# Patient Record
Sex: Male | Born: 1980 | Race: Black or African American | Hispanic: No | Marital: Single | Smoking: Current every day smoker
Health system: Southern US, Community
[De-identification: ages and names within clinical notes are randomized; demographics above are authoritative.]

## PROBLEM LIST (undated history)

## (undated) DIAGNOSIS — Z720 Tobacco use: Secondary | ICD-10-CM

## (undated) DIAGNOSIS — E079 Disorder of thyroid, unspecified: Secondary | ICD-10-CM

## (undated) DIAGNOSIS — E039 Hypothyroidism, unspecified: Secondary | ICD-10-CM

## (undated) DIAGNOSIS — J45909 Unspecified asthma, uncomplicated: Secondary | ICD-10-CM

## (undated) HISTORY — PX: NO PAST SURGERIES: SHX2092

## (undated) HISTORY — DX: Unspecified asthma, uncomplicated: J45.909

## (undated) HISTORY — DX: Hypothyroidism, unspecified: E03.9

---

## 1999-06-09 ENCOUNTER — Emergency Department (HOSPITAL_COMMUNITY): Admission: EM | Admit: 1999-06-09 | Discharge: 1999-06-09 | Payer: Self-pay | Admitting: Emergency Medicine

## 2003-08-08 ENCOUNTER — Emergency Department (HOSPITAL_COMMUNITY): Admission: EM | Admit: 2003-08-08 | Discharge: 2003-08-09 | Payer: Self-pay | Admitting: *Deleted

## 2004-10-26 ENCOUNTER — Ambulatory Visit: Payer: Self-pay | Admitting: Family Medicine

## 2009-12-23 ENCOUNTER — Emergency Department (HOSPITAL_COMMUNITY): Admission: EM | Admit: 2009-12-23 | Discharge: 2009-12-24 | Payer: Self-pay | Admitting: Emergency Medicine

## 2010-07-16 ENCOUNTER — Emergency Department (HOSPITAL_COMMUNITY)
Admission: EM | Admit: 2010-07-16 | Discharge: 2010-07-16 | Disposition: A | Discharge status: Home / Self Care | Payer: BC Managed Care – PPO | Attending: Emergency Medicine | Admitting: Emergency Medicine

## 2010-07-16 DIAGNOSIS — R0989 Other specified symptoms and signs involving the circulatory and respiratory systems: Secondary | ICD-10-CM | POA: Insufficient documentation

## 2010-07-16 DIAGNOSIS — R630 Anorexia: Secondary | ICD-10-CM | POA: Insufficient documentation

## 2010-07-16 DIAGNOSIS — R059 Cough, unspecified: Secondary | ICD-10-CM | POA: Insufficient documentation

## 2010-07-16 DIAGNOSIS — J351 Hypertrophy of tonsils: Secondary | ICD-10-CM | POA: Insufficient documentation

## 2010-07-16 DIAGNOSIS — R51 Headache: Secondary | ICD-10-CM | POA: Insufficient documentation

## 2010-07-16 DIAGNOSIS — R6889 Other general symptoms and signs: Secondary | ICD-10-CM | POA: Insufficient documentation

## 2010-07-16 DIAGNOSIS — IMO0001 Reserved for inherently not codable concepts without codable children: Secondary | ICD-10-CM | POA: Insufficient documentation

## 2010-07-16 DIAGNOSIS — R062 Wheezing: Secondary | ICD-10-CM | POA: Insufficient documentation

## 2010-07-16 DIAGNOSIS — J020 Streptococcal pharyngitis: Secondary | ICD-10-CM | POA: Insufficient documentation

## 2010-07-16 DIAGNOSIS — R070 Pain in throat: Secondary | ICD-10-CM | POA: Insufficient documentation

## 2010-07-16 DIAGNOSIS — R0609 Other forms of dyspnea: Secondary | ICD-10-CM | POA: Insufficient documentation

## 2010-07-16 DIAGNOSIS — R05 Cough: Secondary | ICD-10-CM | POA: Insufficient documentation

## 2010-07-16 LAB — RAPID STREP SCREEN (MED CTR MEBANE ONLY): Streptococcus, Group A Screen (Direct): POSITIVE — AB

## 2010-08-26 LAB — CBC
Hemoglobin: 14.8 g/dL (ref 13.0–17.0)
MCHC: 33.8 g/dL (ref 30.0–36.0)
RDW: 13.2 % (ref 11.5–15.5)
WBC: 6 10*3/uL (ref 4.0–10.5)

## 2010-08-26 LAB — DIFFERENTIAL
Basophils Absolute: 0 10*3/uL (ref 0.0–0.1)
Basophils Relative: 1 % (ref 0–1)
Lymphocytes Relative: 27 % (ref 12–46)
Neutro Abs: 3.9 10*3/uL (ref 1.7–7.7)
Neutrophils Relative %: 65 % (ref 43–77)

## 2010-08-26 LAB — URINALYSIS, ROUTINE W REFLEX MICROSCOPIC
Bilirubin Urine: NEGATIVE
Nitrite: NEGATIVE
Specific Gravity, Urine: 1.02 (ref 1.005–1.030)
Urobilinogen, UA: 0.2 mg/dL (ref 0.0–1.0)
pH: 7.5 (ref 5.0–8.0)

## 2010-08-26 LAB — BASIC METABOLIC PANEL
Calcium: 9.6 mg/dL (ref 8.4–10.5)
GFR calc non Af Amer: >60 mL/min (ref >60)
Glucose, Bld: 100 mg/dL — ABNORMAL HIGH (ref 70–99)
Potassium: 3.8 mEq/L (ref 3.5–5.1)
Sodium: 140 mEq/L (ref 135–145)

## 2010-12-29 ENCOUNTER — Other Ambulatory Visit (HOSPITAL_COMMUNITY): Payer: Self-pay | Admitting: Family Medicine

## 2010-12-29 DIAGNOSIS — Z719 Counseling, unspecified: Secondary | ICD-10-CM

## 2011-01-02 ENCOUNTER — Ambulatory Visit (HOSPITAL_COMMUNITY)
Admission: RE | Admit: 2011-01-02 | Discharge: 2011-01-02 | Disposition: A | Discharge status: Home / Self Care | Payer: BC Managed Care – PPO | Source: Ambulatory Visit | Attending: Family Medicine | Admitting: Family Medicine

## 2011-01-02 DIAGNOSIS — Z719 Counseling, unspecified: Secondary | ICD-10-CM

## 2011-01-02 DIAGNOSIS — R1031 Right lower quadrant pain: Secondary | ICD-10-CM | POA: Insufficient documentation

## 2011-02-27 ENCOUNTER — Emergency Department (HOSPITAL_COMMUNITY)
Admission: EM | Admit: 2011-02-27 | Discharge: 2011-02-27 | Disposition: A | Discharge status: Home / Self Care | Payer: BC Managed Care – PPO | Attending: Emergency Medicine | Admitting: Emergency Medicine

## 2011-02-27 ENCOUNTER — Encounter: Payer: Self-pay | Admitting: *Deleted

## 2011-02-27 DIAGNOSIS — S01511A Laceration without foreign body of lip, initial encounter: Secondary | ICD-10-CM

## 2011-02-27 DIAGNOSIS — F17200 Nicotine dependence, unspecified, uncomplicated: Secondary | ICD-10-CM | POA: Insufficient documentation

## 2011-02-27 DIAGNOSIS — S01501A Unspecified open wound of lip, initial encounter: Secondary | ICD-10-CM | POA: Insufficient documentation

## 2011-02-27 MED ORDER — LIDOCAINE HCL (PF) 1 % IJ SOLN
5.0000 mL | Freq: Once | INTRAMUSCULAR | Status: AC
  Administered 2011-02-27: 5 mL
  Filled 2011-02-27: qty 5
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
## 2011-03-06 NOTE — ED Provider Notes (Signed)
Medical screening examination/treatment/procedure(s) were performed by non-physician practitioner and as supervising physician I was immediately available for consultation/collaboration.   Nikhita Mentzel R Rondo Spittler, MD 03/06/11 1122 

## 2011-10-31 ENCOUNTER — Emergency Department (HOSPITAL_COMMUNITY)
Admission: EM | Admit: 2011-10-31 | Discharge: 2011-10-31 | Disposition: A | Discharge status: Home / Self Care | Payer: BC Managed Care – PPO | Attending: Emergency Medicine | Admitting: Emergency Medicine

## 2011-10-31 ENCOUNTER — Emergency Department (HOSPITAL_COMMUNITY): Payer: BC Managed Care – PPO

## 2011-10-31 ENCOUNTER — Encounter (HOSPITAL_COMMUNITY): Payer: Self-pay | Admitting: *Deleted

## 2011-10-31 DIAGNOSIS — T22219A Burn of second degree of unspecified forearm, initial encounter: Secondary | ICD-10-CM | POA: Insufficient documentation

## 2011-10-31 DIAGNOSIS — T310 Burns involving less than 10% of body surface: Secondary | ICD-10-CM | POA: Insufficient documentation

## 2011-10-31 DIAGNOSIS — Y92009 Unspecified place in unspecified non-institutional (private) residence as the place of occurrence of the external cause: Secondary | ICD-10-CM | POA: Insufficient documentation

## 2011-10-31 DIAGNOSIS — R062 Wheezing: Secondary | ICD-10-CM | POA: Insufficient documentation

## 2011-10-31 DIAGNOSIS — X020XXA Exposure to flames in controlled fire in building or structure, initial encounter: Secondary | ICD-10-CM | POA: Insufficient documentation

## 2011-10-31 MED ORDER — TETANUS-DIPHTH-ACELL PERTUSSIS 5-2.5-18.5 LF-MCG/0.5 IM SUSP
0.5000 mL | Freq: Once | INTRAMUSCULAR | Status: DC
  Filled 2011-10-31 (×2): qty 0.5

## 2011-10-31 MED ORDER — ALBUTEROL SULFATE (5 MG/ML) 0.5% IN NEBU
2.5000 mg | INHALATION_SOLUTION | Freq: Once | RESPIRATORY_TRACT | Status: AC
  Administered 2011-10-31: 2.5 mg via RESPIRATORY_TRACT
  Filled 2011-10-31: qty 0.5

## 2011-10-31 MED ORDER — ONDANSETRON 4 MG PO TBDP
4.0000 mg | ORAL_TABLET | Freq: Once | ORAL | Status: AC
  Administered 2011-10-31: 4 mg via ORAL
  Filled 2011-10-31: qty 1

## 2011-10-31 MED ORDER — HYDROMORPHONE HCL PF 2 MG/ML IJ SOLN
2.0000 mg | Freq: Once | INTRAMUSCULAR | Status: AC
  Administered 2011-10-31: 2 mg via INTRAMUSCULAR
  Filled 2011-10-31: qty 1

## 2011-10-31 MED ORDER — OXYCODONE-ACETAMINOPHEN 5-325 MG PO TABS
2.0000 | ORAL_TABLET | Freq: Once | ORAL | Status: AC
  Administered 2011-10-31: 2 via ORAL
  Filled 2011-10-31: qty 2

## 2011-10-31 MED ORDER — SILVER SULFADIAZINE 1 % EX CREA: TOPICAL_CREAM | Freq: Every day | CUTANEOUS | Status: DC

## 2011-10-31 MED ORDER — DIPHTH-ACELL PERTUSSIS-TETANUS 25-58-10 LF-MCG/0.5 IM SUSP
0.5000 mL | Freq: Once | INTRAMUSCULAR | Status: AC
  Administered 2011-10-31: 0.5 mL via INTRAMUSCULAR

## 2011-10-31 MED ORDER — IBUPROFEN 800 MG PO TABS: 800.0000 mg | ORAL_TABLET | Freq: Three times a day (TID) | ORAL | Status: AC

## 2011-10-31 MED ORDER — OXYCODONE-ACETAMINOPHEN 5-325 MG PO TABS: 2.0000 | ORAL_TABLET | ORAL | Status: AC | PRN

## 2011-10-31 NOTE — ED Notes (Signed)
Report given to Brandy, RN.

## 2011-10-31 NOTE — Discharge Instructions (Signed)

## 2011-10-31 NOTE — ED Notes (Signed)
Patient ambulatory to radiology. Steady gait.
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
## 2011-10-31 NOTE — ED Notes (Signed)
RT at bedside for breathing treatment. Clear lung sounds in all fields. Equal chest rise and fall, regular, unlabored. Family at bedside with patient. Pain 8\10. Will continue to monitor.
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
## 2011-10-31 NOTE — ED Notes (Signed)
MD at bedside. 

## 2011-12-05 ENCOUNTER — Encounter (HOSPITAL_COMMUNITY): Payer: Self-pay | Admitting: Emergency Medicine

## 2011-12-05 ENCOUNTER — Emergency Department (HOSPITAL_COMMUNITY)
Admission: EM | Admit: 2011-12-05 | Discharge: 2011-12-06 | Disposition: A | Discharge status: Home / Self Care | Payer: BC Managed Care – PPO | Attending: Emergency Medicine | Admitting: Emergency Medicine

## 2011-12-05 ENCOUNTER — Emergency Department (HOSPITAL_COMMUNITY): Payer: BC Managed Care – PPO

## 2011-12-05 DIAGNOSIS — R091 Pleurisy: Secondary | ICD-10-CM

## 2011-12-05 DIAGNOSIS — R071 Chest pain on breathing: Secondary | ICD-10-CM | POA: Insufficient documentation

## 2011-12-05 DIAGNOSIS — F17200 Nicotine dependence, unspecified, uncomplicated: Secondary | ICD-10-CM | POA: Insufficient documentation

## 2011-12-05 LAB — CBC WITH DIFFERENTIAL/PLATELET
HCT: 45.9 % (ref 39.0–52.0)
Hemoglobin: 15.2 g/dL (ref 13.0–17.0)
Lymphocytes Relative: 23 % (ref 12–46)
Lymphs Abs: 1.6 10*3/uL (ref 0.7–4.0)
Monocytes Absolute: 0.6 10*3/uL (ref 0.1–1.0)
Monocytes Relative: 8 % (ref 3–12)
Neutro Abs: 4.6 10*3/uL (ref 1.7–7.7)
Neutrophils Relative %: 67 % (ref 43–77)
RBC: 5.5 MIL/uL (ref 4.22–5.81)

## 2011-12-05 MED ORDER — KETOROLAC TROMETHAMINE 60 MG/2ML IM SOLN
60.0000 mg | Freq: Once | INTRAMUSCULAR | Status: AC
  Administered 2011-12-05: 60 mg via INTRAMUSCULAR
  Filled 2011-12-05: qty 2

## 2011-12-05 NOTE — ED Notes (Signed)
Patient c/o generalized chest pain with deep breaths since noon today.  Denies nausea or vomiting.  States has been taking shallow breaths due to pain.

## 2011-12-06 LAB — COMPREHENSIVE METABOLIC PANEL
Albumin: 4.2 g/dL (ref 3.5–5.2)
Alkaline Phosphatase: 58 U/L (ref 39–117)
BUN: 14 mg/dL (ref 6–23)
CO2: 25 mEq/L (ref 19–32)
Chloride: 101 mEq/L (ref 96–112)
GFR calc Af Amer: >90 mL/min (ref >90)
GFR calc non Af Amer: >90 mL/min (ref >90)
Glucose, Bld: 105 mg/dL — ABNORMAL HIGH (ref 70–99)
Potassium: 3.7 mEq/L (ref 3.5–5.1)
Total Bilirubin: 0.4 mg/dL (ref 0.3–1.2)

## 2011-12-06 LAB — LIPASE, BLOOD: Lipase: 21 U/L (ref 11–59)

## 2011-12-06 MED ORDER — IBUPROFEN 800 MG PO TABS: 800.0000 mg | ORAL_TABLET | Freq: Three times a day (TID) | ORAL | Status: AC

## 2011-12-06 NOTE — Discharge Instructions (Signed)
Use the ibuprofen along with the heat application (warm shower or heating pad) to your chest 15 minutes several times daily.  Get rechecked by your doctor if not better over the next 5-7  days.

## 2011-12-06 NOTE — ED Provider Notes (Signed)
History     CSN: [id number]  Arrival date & time 12/05/11  2135   First MD Initiated Contact with Patient 12/05/11 2232      Chief Complaint  Patient presents with  . Pleurisy    (Consider location/radiation/quality/duration/timing/severity/associated sxs/prior treatment) HPI Comments: Donald Rollins present with gradual onset of mid sternal chest pain which is worse with deep inspiration and started around noon as he was driving to work.  He is pain free at rest,  But pain is worsened with deep inspiration,  Also when he attempts to sit from a lying position and with palpation.  Pain radiates into his midback.  He denies fevers,  Chills,  Nightsweats,  Shortness of breath,  Nausea, vomiting or abdominal pain.  He has tried no medications for relief of pain.  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  The history is provided by the patient.    History reviewed. No pertinent past medical history.  History reviewed. No pertinent past surgical history.  No family history on file.  History  Substance Use Topics  . Smoking status: Current Everyday Smoker -- 0.5 packs/day for 10 years    Types: Cigarettes  . Smokeless tobacco: Not on file  . Alcohol Use: No      Review of Systems  Constitutional: Negative for fever.  HENT: Negative for congestion, sore throat and neck pain.   Eyes: Negative.   Respiratory: Negative for cough, choking, chest tightness, shortness of breath, wheezing and stridor.   Cardiovascular: Positive for chest pain. Negative for palpitations and leg swelling.  Gastrointestinal: Negative for nausea and abdominal pain.  Genitourinary: Negative.   Musculoskeletal: Negative for joint swelling and arthralgias.  Skin: Negative.  Negative for rash and wound.  Neurological: Negative for dizziness, weakness, light-headedness, numbness and headaches.  Hematological: Negative.   Psychiatric/Behavioral: Negative.     Allergies  Shellfish  allergy  Home Medications   Current Outpatient Rx  Name Route Sig Dispense Refill  . IBUPROFEN 800 MG PO TABS Oral Take 1 tablet (800 mg total) by mouth 3 (three) times daily. 21 tablet 0    BP 109/66  Pulse 75  Temp 98 F (36.7 C) (Oral)  Resp 20  Ht 5\' 10"  (1.778 m)  Wt 260 lb (117.935 kg)  BMI 37.31 kg/m2  SpO2 97%  Physical Exam  Nursing note and vitals reviewed. Constitutional: He appears well-developed and well-nourished.  HENT:  Head: Normocephalic and atraumatic.  Neck: Normal range of motion.  Cardiovascular: Normal rate, regular rhythm, normal heart sounds and intact distal pulses.   Pulmonary/Chest: Effort normal and breath sounds normal. He has no wheezes. He exhibits tenderness.       TTP at xiphoid process and along bilateral lower anterior ribs.  Abdominal: Soft. Bowel sounds are normal. He exhibits no distension. There is tenderness in the right upper quadrant and epigastric area.  Musculoskeletal: Normal range of motion.  Neurological: He is alert.  Skin: Skin is warm and dry.  Psychiatric: He has a normal mood and affect.    ED Course  Procedures (including critical care time)  Labs Reviewed  COMPREHENSIVE METABOLIC PANEL - Abnormal; Notable for the following:    Glucose, Bld 105 (*)     All other components within normal limits  CBC WITH DIFFERENTIAL  LIPASE, BLOOD   Dg Chest 2 View  12/05/2011  *RADIOLOGY REPORT*  Clinical Data: 31 year old with chest pain on inspiration.  CHEST - 2 VIEW  Comparison: 10/31/2011.  Findings: Low lung volumes.  Crowding markings at the lung bases. Cardiac size and mediastinal contours are within normal limits. Visualized tracheal air column is within normal limits.  No pneumothorax, pulmonary edema, pleural effusion or consolidation. No acute osseous abnormality identified.  IMPRESSION: Low lung volumes, otherwise no acute cardiopulmonary abnormality.  Original Report Authenticated By: Harley Hallmark, M.D.      Diagnosis: Chest wall pain    MDM  Patient prescribed ibuprofen 800 mg tid.  Advised heat therapy.    Recheck if not better over the next week. Pt has no risk factors for PE.  Vitals signs stable in ed.  Given toradol 60 IM with relief of sx.    Date: 12/05/2011  Rate: 95  Rhythm: normal sinus rhythm  QRS Axis: normal  Intervals: normal  ST/T Wave abnormalities: normal  Conduction Disutrbances:none  Narrative Interpretation: LVH  Old EKG Reviewed: unchanged          Burgess Amor, PA 12/06/11 [phone]

## 2011-12-17 NOTE — ED Provider Notes (Signed)
Medical screening examination/treatment/procedure(s) were performed by non-physician practitioner and as supervising physician I was immediately available for consultation/collaboration.  Donnetta Hutching, MD 12/17/11 [phone]

## 2014-03-01 ENCOUNTER — Encounter (HOSPITAL_COMMUNITY): Payer: Self-pay | Admitting: Emergency Medicine

## 2014-03-01 ENCOUNTER — Emergency Department (HOSPITAL_COMMUNITY)
Admission: EM | Admit: 2014-03-01 | Discharge: 2014-03-01 | Disposition: A | Discharge status: Home / Self Care | Payer: BC Managed Care – PPO | Attending: Emergency Medicine | Admitting: Emergency Medicine

## 2014-03-01 DIAGNOSIS — F17200 Nicotine dependence, unspecified, uncomplicated: Secondary | ICD-10-CM | POA: Diagnosis not present

## 2014-03-01 DIAGNOSIS — S058X9A Other injuries of unspecified eye and orbit, initial encounter: Secondary | ICD-10-CM | POA: Diagnosis not present

## 2014-03-01 DIAGNOSIS — S0502XA Injury of conjunctiva and corneal abrasion without foreign body, left eye, initial encounter: Secondary | ICD-10-CM

## 2014-03-01 DIAGNOSIS — Y939 Activity, unspecified: Secondary | ICD-10-CM | POA: Insufficient documentation

## 2014-03-01 DIAGNOSIS — Y929 Unspecified place or not applicable: Secondary | ICD-10-CM | POA: Diagnosis not present

## 2014-03-01 DIAGNOSIS — X58XXXA Exposure to other specified factors, initial encounter: Secondary | ICD-10-CM | POA: Diagnosis not present

## 2014-03-01 DIAGNOSIS — H538 Other visual disturbances: Secondary | ICD-10-CM | POA: Insufficient documentation

## 2014-03-01 DIAGNOSIS — H539 Unspecified visual disturbance: Secondary | ICD-10-CM

## 2014-03-01 LAB — CBG MONITORING, ED: Glucose-Capillary: 87 mg/dL (ref 70–99)

## 2014-03-01 MED ORDER — TETRACAINE HCL 0.5 % OP SOLN
2.0000 [drp] | Freq: Once | OPHTHALMIC | Status: DC
  Filled 2014-03-01: qty 2

## 2014-03-01 MED ORDER — POLYMYXIN B-TRIMETHOPRIM 10000-0.1 UNIT/ML-% OP SOLN: 1.0000 [drp] | OPHTHALMIC | Status: AC

## 2014-03-01 MED ORDER — FLUORESCEIN SODIUM 1 MG OP STRP
1.0000 | ORAL_STRIP | Freq: Once | OPHTHALMIC | Status: DC
  Filled 2014-03-01: qty 1

## 2014-03-01 NOTE — Discharge Instructions (Signed)
Corneal Abrasion Call Dr. Lita Mains for an appointment tomorrow. Return to the ED if you develop worsening vision, headache, weakness, numbness, tingling, or any other concerns. The cornea is the clear covering at the front and center of the eye. When looking at the colored portion of the eye (iris), you are looking through the cornea. This very thin tissue is made up of many layers. The surface layer is a single layer of cells (corneal epithelium) and is one of the most sensitive tissues in the body. If a scratch or injury causes the corneal epithelium to come off, it is called a corneal abrasion. If the injury extends to the tissues below the epithelium, the condition is called a corneal ulcer. CAUSES   Scratches.  Trauma.  Foreign body in the eye. Some people have recurrences of abrasions in the area of the original injury even after it has healed (recurrent erosion syndrome). Recurrent erosion syndrome generally improves and goes away with time. SYMPTOMS   Eye pain.  Difficulty or inability to keep the injured eye open.  The eye becomes very sensitive to light.  Recurrent erosions tend to happen suddenly, first thing in the morning, usually after waking up and opening the eye. DIAGNOSIS  Your health care provider can diagnose a corneal abrasion during an eye exam. Dye is usually placed in the eye using a drop or a small paper strip moistened by your tears. When the eye is examined with a special light, the abrasion shows up clearly because of the dye. TREATMENT   Small abrasions may be treated with antibiotic drops or ointment alone.  A pressure patch may be put over the eye. If this is done, follow your doctor's instructions for when to remove the patch. Do not drive or use machines while the eye patch is on. Judging distances is hard to do with a patch on. If the abrasion becomes infected and spreads to the deeper tissues of the cornea, a corneal ulcer can result. This is serious  because it can cause corneal scarring. Corneal scars interfere with light passing through the cornea and cause a loss of vision in the involved eye. HOME CARE INSTRUCTIONS  Use medicine or ointment as directed. Only take over-the-counter or prescription medicines for pain, discomfort, or fever as directed by your health care provider.  Do not drive or operate machinery if your eye is patched. Your ability to judge distances is impaired.  If your health care provider has given you a follow-up appointment, it is very important to keep that appointment. Not keeping the appointment could result in a severe eye infection or permanent loss of vision. If there is any problem keeping the appointment, let your health care provider know. SEEK MEDICAL CARE IF:   You have pain, light sensitivity, and a scratchy feeling in one eye or both eyes.  Your pressure patch keeps loosening up, and you can blink your eye under the patch after treatment.  Any kind of discharge develops from the eye after treatment or if the lids stick together in the morning.  You have the same symptoms in the morning as you did with the original abrasion days, weeks, or months after the abrasion healed. MAKE SURE YOU:   Understand these instructions.  Will watch your condition.  Will get help right away if you are not doing well or get worse. Document Released: 05/25/2000 Document Revised: 06/02/2013 Document Reviewed: 02/02/2013 [facility] Patient Information 2015 [place], [place]. This information is not intended to replace advice  given to you by your health care provider. Make sure you discuss any questions you have with your health care provider.

## 2014-03-01 NOTE — ED Notes (Signed)
Woke up on Saturday with blurred vision and has been that way since. Now having dizziness per pt. Had diarrhea this past week per pt.

## 2014-03-01 NOTE — ED Provider Notes (Signed)
CSN: [id number]     Arrival date & time 03/01/14  1913 History   First MD Initiated Contact with Patient 03/01/14 1941     Chief Complaint  Patient presents with  . Blurred Vision     (Consider location/radiation/quality/duration/timing/severity/associated sxs/prior Treatment) HPI Comments: Patient presents with a three-day history of blurry vision to his left eye. He states he's having trouble reading words and numbers. His right eye is normal. He denies any trauma. He denies getting anything into his eye. He denies any eye pain. He feels pressure behind his left eye. Denies headache, dizziness, nausea or vomiting.He does not wear glasses or contacts. 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 No other medical problems. He is not a diabetic. No focal weakness, numbness or tingling. No double vision.  The history is provided by the patient.    History reviewed. No pertinent past medical history. History reviewed. No pertinent past surgical history. No family history on file. History  Substance Use Topics  . Smoking status: Current Every Day Smoker -- 0.50 packs/day for 10 years    Types: Cigarettes  . Smokeless tobacco: Not on file  . Alcohol Use: No    Review of Systems  Constitutional: Negative for fever, activity change and appetite change.  HENT: Negative for congestion and rhinorrhea.   Eyes: Positive for visual disturbance. Negative for pain.  Respiratory: Negative for cough, chest tightness and shortness of breath.   Cardiovascular: Negative for chest pain.  Gastrointestinal: Negative for nausea, vomiting and abdominal pain.  Genitourinary: Negative for dysuria and hematuria.  Musculoskeletal: Negative for arthralgias, back pain and myalgias.  Skin: Negative for rash.  Neurological: Negative for dizziness, weakness, light-headedness, numbness and headaches.  A complete 10 system review of systems was obtained and all systems are negative except as  noted in the HPI and PMH.      Allergies  Shellfish allergy  Home Medications   Prior to Admission medications   Medication Sig Start Date End Date Taking? Authorizing Provider  trimethoprim-polymyxin b (POLYTRIM) ophthalmic solution Place 1 drop into the left eye every 4 (four) hours. 03/01/14 03/08/14  Glynn Octave, MD   BP 144/91  Pulse 94  Temp(Src) 99.3 F (37.4 C) (Oral)  Resp 18  Ht  (1.803 m)  Wt 275 lb (124.739 kg)  BMI 38.37 kg/m2  SpO2 96% Physical Exam  Nursing note and vitals reviewed. Constitutional: He is oriented to person, place, and time. He appears well-developed and well-nourished. No distress.  HENT:  Head: Normocephalic and atraumatic.  Mouth/Throat: Oropharynx is clear and moist. No oropharyngeal exudate.  Eyes: Conjunctivae and EOM are normal. Pupils are equal, round, and reactive to light. Lids are everted and swept, no foreign bodies found. No foreign body present in the left eye. Right conjunctiva is not injected. Right conjunctiva has no hemorrhage. Left conjunctiva is not injected. Left conjunctiva has no hemorrhage.  Slit lamp exam:      The right eye shows no fluorescein uptake and no anterior chamber bulge.       The left eye shows corneal abrasion. The left eye shows no foreign body, no hyphema and no hypopyon.    No pain with EOMI. Linear fluoroscein uptake.  IOP 9. No hyphema or hypopion.  Anterior chamber clear.  Neck: Normal range of motion. Neck supple.  No meningismus.  Cardiovascular: Normal rate, regular rhythm, normal heart sounds and intact distal pulses.   No murmur heard. Pulmonary/Chest:  Effort normal and breath sounds normal. No respiratory distress.  Abdominal: Soft. There is no tenderness. There is no rebound and no guarding.  Musculoskeletal: Normal range of motion. He exhibits no edema and no tenderness.  Neurological: He is alert and oriented to person, place, and time. No cranial nerve deficit. He exhibits  normal muscle tone. Coordination normal.  No ataxia on finger to nose bilaterally. No pronator drift. 5/5 strength throughout. CN 2-12 intact. Negative Romberg. Equal grip strength. Sensation intact. Gait is normal.   Skin: Skin is warm.  Psychiatric: He has a normal mood and affect. His behavior is normal.    ED Course  Procedures (including critical care time) Labs Review Labs Reviewed  CBG MONITORING, ED    Imaging Review No results found.   EKG Interpretation None      MDM   Final diagnoses:  Corneal abrasion, left, initial encounter  Visual disturbance   Blurry vision and left eye for the past 3 days. Denies pain. Denies trauma. No glasses or contact lens use.  CBG normal Visual Acuity - Bilateral Distance: 20/20 ; R Distance: 20/20 ; L Distance: 20/70 Zollie Pee, RN      Area of fluorescein uptake seen overlying the left cornea. This is linear. Denies any trauma. This does not appear to be dendritic but herpetic infection cannot be ruled out. Discussed with Dr. Lita Mains of ophthalmology who will see patient tomorrow afternoon. He agrees with treating for corneal abrasion. Does not recommend anti-virals at this tine.  Patient states he is concerned about aneurysm. He currently has no headache, or dizziness. he has a normal neurological exam. Normal CT in 2011. He was reassured that he appears to have a lesion on his cornea causing his blurry vision. He was offered CT scan tonight for reassurance which he declined.  Tetanus is uptodate.  polytrim prescribed. Follow up with Dr. Lita Mains tomorrow. Return precautions discussed.   Glynn Octave, MD 03/01/14 2203

## 2014-03-01 NOTE — ED Notes (Signed)
Patient states that his vision in his right eye is fine, but he can't read anything with his left eye. Feels like pressure behind his right eye.

## 2015-07-15 ENCOUNTER — Encounter: Payer: Self-pay | Admitting: "Endocrinology

## 2015-07-15 ENCOUNTER — Ambulatory Visit (INDEPENDENT_AMBULATORY_CARE_PROVIDER_SITE_OTHER): Payer: BLUE CROSS/BLUE SHIELD | Admitting: "Endocrinology

## 2015-07-15 VITALS — BP 125/74 | HR 104 | Ht 70.0 in | Wt 286.0 lb

## 2015-07-15 DIAGNOSIS — E890 Postprocedural hypothyroidism: Secondary | ICD-10-CM | POA: Insufficient documentation

## 2015-07-15 DIAGNOSIS — E0590 Thyrotoxicosis, unspecified without thyrotoxic crisis or storm: Secondary | ICD-10-CM | POA: Diagnosis not present

## 2015-07-15 NOTE — Progress Notes (Signed)
Subjective:    Patient ID: Donald Rollins, male    DOB: [birth year], PCP MANN, BENJAMIN, PA-C.   No past medical history on file. No past surgical history on file. Social History   Social History  . Marital Status: Single    Spouse Name: N/A  . Number of Children: N/A  . Years of Education: N/A   Social History Main Topics  . Smoking status: Current Every Day Smoker -- 0.50 packs/day for 10 years    Types: Cigarettes  . Smokeless tobacco: None  . Alcohol Use: No  . Drug Use: No  . Sexual Activity: Not Asked   Other Topics Concern  . None   Social History Narrative   No outpatient encounter prescriptions on file as of 07/15/2015.   No facility-administered encounter medications on file as of 07/15/2015.   ALLERGIES: Allergies  Allergen Reactions  . Shellfish Allergy Swelling    Facial swelling   VACCINATION STATUS: Immunization History  Administered Date(s) Administered  . DTaP 10/31/2011    HPI  The patient presents today with a medical history as above, and is being seen in consultation for hyperthyroidism requested by Lenise Herald- PAC. - The patient has been dealing with symptoms of  palpitations, weight loss, and tremors for several weeks. These symptoms are progressively worsening and troubling to patient.   The patient denies family history of thyroid dysfunction ,and the patient denies personal history of goiter. Patient is not on any anti-thyroid medications nor on any thyroid hormone supplements. Patient  is willing to proceed with appropriate work up and therapy for thyrotoxicosis.   Review of Systems  Constitutional: Lost approximately 8 pounds,  no fatigue, no subjective hyperthermia/hypothermia Eyes: no blurry vision, no xerophthalmia ENT: no sore throat, no nodules palpated in throat, no dysphagia/odynophagia, no hoarseness Cardiovascular: no CP/SOB/palpitations/leg swelling Respiratory: no cough/SOB Gastrointestinal: no  N/V/D/C Musculoskeletal: no muscle/joint aches Skin: no rashes Neurological: + tremors,  Psychiatric: no depression/anxiety  Objective:    BP 125/74 mmHg  Pulse 104  Ht  (1.778 m)  Wt 286 lb (129.729 kg)  BMI 41.04 kg/m2  SpO2 100%  Wt Readings from Last 3 Encounters:  07/15/15 286 lb (129.729 kg)  03/01/14 275 lb (124.739 kg)  12/05/11 260 lb (117.935 kg)    Physical Exam   Constitutional: overweight, in NAD Eyes: PERRLA, EOMI, no exophthalmos ENT: moist mucous membranes, no thyromegaly, no cervical lymphadenopathy Cardiovascular: RRR, No MRG Respiratory: CTA B Gastrointestinal: abdomen soft, NT, ND, BS+ Musculoskeletal: no deformities, strength intact in all 4 Skin: moist, warm, no rashes Neurological:  +tremor with outstretched hands, DTR normal in all 4   Results for orders placed or performed during the hospital encounter of 03/01/14  CBG monitoring, ED  Result Value Ref Range   Glucose-Capillary 87 70 - 99 mg/dL   Comment 1 Notify RN    Complete Blood Count (Most recent): Lab Results  Component Value Date   WBC 6.9 12/05/2011   HGB 15.2 12/05/2011   HCT 45.9 12/05/2011   MCV 83.5 12/05/2011   PLT 164 12/05/2011   Chemistry (most recent): Lab Results  Component Value Date   NA 138 12/05/2011   K 3.7 12/05/2011   CL 101 12/05/2011   CO2 25 12/05/2011   BUN 14 12/05/2011   CREATININE 1.08 12/05/2011   On 06/23/2015 his TSH was suppressed below 0.006  Assessment & Plan:   1. Hyperthyroidism  Patient's history and most recent labs are reviewed. 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 The potential risks of untreated thyrotoxicosis and the need for definitive therapy have been discussed in detail with the patient, and the patient agrees to proceed with plan.   I like to repeat full profile thyroid function tests today and confirmatory thyroid uptake and scan will be scheduled to be done as soon as possible.    Therapy may involve RAI ablation of the thyroid, with subsequent need for lifelong thyroid hormone replacement. Pt is made aware of this fact and willing to proceed. The patient will return in 1 week for treatment decision.  - 40 minutes of time was spent on the care of this patient , 50% of which was applied for counseling on complications of thyrotoxicosis, options ,  and the need for definitive therapy to prevent these complications. -He is advised on smoking cessation. - I advised patient to maintain close follow up with Lenise Herald, PA-C for primary care needs.  Follow up plan: Return in about 1 week (around 07/22/2015) for overactive thyroid, thyroid uptake and scan, labs today.  Marquis Lunch, MD Phone: [phone]  Fax: [phone]   07/15/2015, 11:39 AM

## 2015-07-21 ENCOUNTER — Encounter (HOSPITAL_COMMUNITY): Payer: BLUE CROSS/BLUE SHIELD

## 2015-07-22 ENCOUNTER — Encounter (HOSPITAL_COMMUNITY): Payer: BLUE CROSS/BLUE SHIELD

## 2015-07-26 ENCOUNTER — Ambulatory Visit: Payer: BLUE CROSS/BLUE SHIELD | Admitting: "Endocrinology

## 2015-08-02 ENCOUNTER — Encounter (HOSPITAL_COMMUNITY)
Admission: RE | Admit: 2015-08-02 | Discharge: 2015-08-02 | Disposition: A | Discharge status: Home / Self Care | Payer: BLUE CROSS/BLUE SHIELD | Source: Ambulatory Visit | Attending: "Endocrinology | Admitting: "Endocrinology

## 2015-08-02 ENCOUNTER — Encounter (HOSPITAL_COMMUNITY): Payer: Self-pay

## 2015-08-02 DIAGNOSIS — E0590 Thyrotoxicosis, unspecified without thyrotoxic crisis or storm: Secondary | ICD-10-CM

## 2015-08-02 MED ORDER — SODIUM IODIDE I 131 CAPSULE
15.0000 | Freq: Once | INTRAVENOUS | Status: AC | PRN
  Administered 2015-08-02: 15 via ORAL

## 2015-08-03 ENCOUNTER — Encounter (HOSPITAL_COMMUNITY)
Admission: RE | Admit: 2015-08-03 | Discharge: 2015-08-03 | Disposition: A | Discharge status: Home / Self Care | Payer: BLUE CROSS/BLUE SHIELD | Source: Ambulatory Visit | Attending: "Endocrinology | Admitting: "Endocrinology

## 2015-08-03 DIAGNOSIS — E0590 Thyrotoxicosis, unspecified without thyrotoxic crisis or storm: Secondary | ICD-10-CM | POA: Diagnosis present

## 2015-08-03 MED ORDER — SODIUM PERTECHNETATE TC 99M INJECTION
10.0000 | Freq: Once | INTRAVENOUS | Status: AC | PRN
Start: 2015-08-03 — End: 2015-08-03
  Administered 2015-08-03: 10 via INTRAVENOUS

## 2015-08-04 ENCOUNTER — Other Ambulatory Visit: Payer: Self-pay | Admitting: "Endocrinology

## 2015-08-05 LAB — TSH: TSH: <0.01 mIU/L — ABNORMAL LOW (ref 0.40–4.50)

## 2015-08-05 LAB — THYROGLOBULIN ANTIBODY: Thyroglobulin Ab: <1 IU/mL (ref <2)

## 2015-08-05 LAB — T3, FREE

## 2015-08-05 LAB — T4, FREE: Free T4: 3.6 ng/dL — ABNORMAL HIGH (ref 0.8–1.8)

## 2015-08-05 LAB — THYROID PEROXIDASE ANTIBODY: Thyroperoxidase Ab SerPl-aCnc: 111 IU/mL — ABNORMAL HIGH (ref <9)

## 2015-08-12 ENCOUNTER — Encounter: Payer: Self-pay | Admitting: "Endocrinology

## 2015-08-12 ENCOUNTER — Ambulatory Visit (INDEPENDENT_AMBULATORY_CARE_PROVIDER_SITE_OTHER): Payer: BLUE CROSS/BLUE SHIELD | Admitting: "Endocrinology

## 2015-08-12 VITALS — BP 129/81 | HR 110 | Ht 70.0 in | Wt 288.0 lb

## 2015-08-12 DIAGNOSIS — E0590 Thyrotoxicosis, unspecified without thyrotoxic crisis or storm: Secondary | ICD-10-CM

## 2015-08-12 MED ORDER — PROPRANOLOL HCL 20 MG PO TABS: 20.0000 mg | ORAL_TABLET | Freq: Two times a day (BID) | ORAL | Status: DC

## 2015-08-12 NOTE — Progress Notes (Signed)
Subjective:    Patient ID: Donald Rollins, male    DOB: June 01, 1981, PCP MANN, BENJAMIN, PA-C.   History reviewed. No pertinent past medical history. History reviewed. No pertinent past surgical history. Social History   Social History  . Marital Status: Single    Spouse Name: N/A  . Number of Children: N/A  . Years of Education: N/A   Social History Main Topics  . Smoking status: Current Every Day Smoker -- 0.50 packs/day for 10 years    Types: Cigarettes  . Smokeless tobacco: None  . Alcohol Use: No  . Drug Use: No  . Sexual Activity: Not Asked   Other Topics Concern  . None   Social History Narrative   Outpatient Encounter Prescriptions as of 08/12/2015  Medication Sig  . propranolol (INDERAL) 20 MG tablet Take 1 tablet (20 mg total) by mouth 2 (two) times daily.   No facility-administered encounter medications on file as of 08/12/2015.   ALLERGIES: Allergies  Allergen Reactions  . Shellfish Allergy Swelling    Facial swelling   VACCINATION STATUS: Immunization History  Administered Date(s) Administered  . DTaP 10/31/2011    HPI  The patient presents today with a medical history as above, and is being seen in consultation for hyperthyroidism requested by Lenise Herald- PAC. - The patient has been dealing with symptoms of  palpitations, weight loss, and tremors for several weeks. These symptoms are progressively worsening and troubling to patient.   The patient denies family history of thyroid dysfunction ,and the patient denies personal history of goiter. Patient is not on any anti-thyroid medications nor on any thyroid hormone supplements. Patient  is willing to proceed with appropriate work up and therapy for thyrotoxicosis.   Review of Systems  Constitutional: Lost approximately 8 pounds,  no fatigue, no subjective hyperthermia/hypothermia Eyes: no blurry vision, no xerophthalmia ENT: no sore throat, no nodules palpated in throat, no  dysphagia/odynophagia, no hoarseness Cardiovascular: no CP/SOB/palpitations/leg swelling Respiratory: no cough/SOB Gastrointestinal: no N/V/D/C Musculoskeletal: no muscle/joint aches Skin: no rashes Neurological: + tremors,  Psychiatric: no depression/anxiety  Objective:    BP 129/81 mmHg  Pulse 110  Ht  (1.778 m)  Wt 288 lb (130.636 kg)  BMI 41.32 kg/m2  SpO2 97%  Wt Readings from Last 3 Encounters:  08/12/15 288 lb (130.636 kg)  07/15/15 286 lb (129.729 kg)  03/01/14 275 lb (124.739 kg)    Physical Exam   Constitutional: overweight, in NAD Eyes: PERRLA, EOMI, no exophthalmos ENT: moist mucous membranes, no thyromegaly, no cervical lymphadenopathy Cardiovascular: RRR, No MRG Respiratory: CTA B Gastrointestinal: abdomen soft, NT, ND, BS+ Musculoskeletal: no deformities, strength intact in all 4 Skin: moist, warm, no rashes Neurological:  +tremor with outstretched hands, DTR normal in all 4   Results for orders placed or performed in visit on 08/04/15  TSH  Result Value Ref Range   TSH <0.01 (L) 0.40 - 4.50 mIU/L  T4, free  Result Value Ref Range   Free T4 3.6 (H) 0.8 - 1.8 ng/dL  T3, free  Result Value Ref Range   T3, Free >20.0 (H) 2.3 - 4.2 pg/mL  Thyroid peroxidase antibody  Result Value Ref Range   Thyroperoxidase Ab SerPl-aCnc 111 (H) <9 IU/mL  Thyroglobulin antibody  Result Value Ref Range   Thyroglobulin Ab <1 <2 IU/mL   Complete Blood Count (Most recent): Lab Results  Component Value Date   WBC 6.9 12/05/2011   HGB 15.2 12/05/2011   HCT 45.9 12/05/2011  MCV 83.5 12/05/2011   PLT 164 12/05/2011   Chemistry (most recent): Lab Results  Component Value Date   NA 138 12/05/2011   K 3.7 12/05/2011   CL 101 12/05/2011   CO2 25 12/05/2011   BUN 14 12/05/2011   CREATININE 1.08 12/05/2011   On 06/23/2015 his TSH was suppressed below 0.006  Assessment & Plan:   1. Hyperthyroidism  Patient's history and most recent labs are reviewed.  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 The potential risks of untreated thyrotoxicosis and the need for definitive therapy have been discussed in detail with the patient, and the patient agrees to proceed with plan. 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 -Options of therapy discussed with him and he agrees with my suggestion of I-131 thyroid ablation.     -He understands the subsequent need for lifelong thyroid hormone replacement. -This therapy would be scheduled to be administered as soon as possible. - symptomatic tachycardia, I have initiated low dose propranolol 20 mg by mouth twice a day. - 25 minutes of time was spent on the care of this patient , 50% of which was applied for counseling on complications of thyrotoxicosis, options ,  and the need for definitive therapy to prevent these complications. -He is advised on smoking cessation. - I advised patient to maintain close follow up with Lenise HeraldMANN, BENJAMIN, PA-C for primary care needs.  Follow up plan: Return in about 8 weeks (around 10/07/2015) for follow up with pre-visit labs, overactive thyroid.  Marquis LunchGebre Eilee Schader, MD Phone: [phone]  Fax: [phone]   08/12/2015, 3:52 PM

## 2015-08-26 ENCOUNTER — Encounter (HOSPITAL_COMMUNITY)
Admission: RE | Admit: 2015-08-26 | Discharge: 2015-08-26 | Disposition: A | Discharge status: Home / Self Care | Payer: BLUE CROSS/BLUE SHIELD | Source: Ambulatory Visit | Attending: "Endocrinology | Admitting: "Endocrinology

## 2015-08-26 DIAGNOSIS — E0590 Thyrotoxicosis, unspecified without thyrotoxic crisis or storm: Secondary | ICD-10-CM | POA: Diagnosis present

## 2015-12-20 ENCOUNTER — Other Ambulatory Visit: Payer: Self-pay | Admitting: "Endocrinology

## 2015-12-21 LAB — T4, FREE: Free T4: 0.7 ng/dL — ABNORMAL LOW (ref 0.8–1.8)

## 2015-12-21 LAB — TSH: TSH: 8.29 mIU/L — ABNORMAL HIGH (ref 0.40–4.50)

## 2015-12-26 ENCOUNTER — Ambulatory Visit (INDEPENDENT_AMBULATORY_CARE_PROVIDER_SITE_OTHER): Payer: BLUE CROSS/BLUE SHIELD | Admitting: "Endocrinology

## 2015-12-26 ENCOUNTER — Encounter: Payer: Self-pay | Admitting: "Endocrinology

## 2015-12-26 VITALS — BP 132/81 | HR 91 | Ht 70.0 in | Wt 301.0 lb

## 2015-12-26 DIAGNOSIS — E890 Postprocedural hypothyroidism: Secondary | ICD-10-CM

## 2015-12-26 MED ORDER — LEVOTHYROXINE SODIUM 100 MCG PO TABS: 100.0000 ug | ORAL_TABLET | Freq: Every day | ORAL | Status: DC

## 2015-12-26 NOTE — Progress Notes (Signed)
Subjective:    Patient ID: Donald Rollins, male    DOB: Aug 02, 1980, PCP MANN, BENJAMIN, PA-C.   History reviewed. No pertinent past medical history. History reviewed. No pertinent past surgical history. Social History   Social History  . Marital Status: Single    Spouse Name: N/A  . Number of Children: N/A  . Years of Education: N/A   Social History Main Topics  . Smoking status: Current Every Day Smoker -- 0.50 packs/day for 10 years    Types: Cigarettes  . Smokeless tobacco: None  . Alcohol Use: No  . Drug Use: No  . Sexual Activity: Not Asked   Other Topics Concern  . None   Social History Narrative   Outpatient Encounter Prescriptions as of 12/26/2015  Medication Sig  . levothyroxine (SYNTHROID, LEVOTHROID) 100 MCG tablet Take 1 tablet (100 mcg total) by mouth daily.  . [DISCONTINUED] propranolol (INDERAL) 20 MG tablet Take 1 tablet (20 mg total) by mouth 2 (two) times daily. (Patient not taking: Reported on 12/26/2015)   No facility-administered encounter medications on file as of 12/26/2015.   ALLERGIES: Allergies  Allergen Reactions  . Shellfish Allergy Swelling    Facial swelling   VACCINATION STATUS: Immunization History  Administered Date(s) Administered  . DTaP 10/31/2011    HPI  The patient presents today with Repeat thyroid function tests. Unfortunately he missed his appointments since March 2016. He was given radioactive iodine therapy on 08/26/2014 and was supposed to return in 8 weeks from that he has gained  13 pounds, feels fatigued, and sleepy.   - He reports result patient of his prior symptoms including palpitations, heat intolerance, sweating, and tremors   - He discontinued his propranolol.  The patient denies family history of thyroid dysfunction ,and the patient denies personal history of goiter. Patient is not on any anti-thyroid medications nor on any thyroid hormone supplements.   Review of Systems  Constitutional:  gained  13  pounds, + fatigue, no subjective hyperthermia/hypothermia Eyes: no blurry vision, no xerophthalmia ENT: no sore throat, no nodules palpated in throat, no dysphagia/odynophagia, no hoarseness Cardiovascular: no CP/SOB/palpitations/leg swelling Respiratory: no cough/SOB Gastrointestinal: no N/V/D/C Musculoskeletal: no muscle/joint aches Skin: no rashes Neurological: + tremors,  Psychiatric: no depression/anxiety  Objective:    BP 132/81 mmHg  Pulse 91  Ht 5\' 10"  (1.778 m)  Wt 301 lb (136.533 kg)  BMI 43.19 kg/m2  Wt Readings from Last 3 Encounters:  12/26/15 301 lb (136.533 kg)  08/12/15 288 lb (130.636 kg)  07/15/15 286 lb (129.729 kg)    Physical Exam   Constitutional: overweight, in NAD Eyes: PERRLA, EOMI, no exophthalmos ENT: moist mucous membranes, no thyromegaly, no cervical lymphadenopathy Cardiovascular: RRR, No MRG Respiratory: CTA B Gastrointestinal: abdomen soft, NT, ND, BS+ Musculoskeletal: no deformities, strength intact in all 4 Skin: moist, warm, no rashes Neurological:  - tremor with outstretched hands, DTR normal in all 4   Results for orders placed or performed in visit on 12/20/15  TSH  Result Value Ref Range   TSH 8.29 (H) 0.40 - 4.50 mIU/L  T4, free  Result Value Ref Range   Free T4 0.7 (L) 0.8 - 1.8 ng/dL   Complete Blood Count (Most recent): Lab Results  Component Value Date   WBC 6.9 12/05/2011   HGB 15.2 12/05/2011   HCT 45.9 12/05/2011   MCV 83.5 12/05/2011   PLT 164 12/05/2011   Chemistry (most recent): Lab Results  Component Value Date   NA  138 12/05/2011   K 3.7 12/05/2011   CL 101 12/05/2011   CO2 25 12/05/2011   BUN 14 12/05/2011   CREATININE 1.08 12/05/2011   On 06/23/2015 his TSH was suppressed below 0.006  Assessment & Plan:   1. RAI hypothyroidism   - he has now radioactive iodine therapy associated hypothyroidism. He will be initiated on thyroid hormone replacement. Though he may need higher dose, I will start with  100 g of levothyroxine by mouth every morning.   - We discussed about correct intake of levothyroxine, at fasting, with water, separated by at least 30 minutes from breakfast, and separated by more than 4 hours from calcium, iron, multivitamins, acid reflux medications (PPIs). -Patient is made aware of the fact that thyroid hormone replacement is needed for life, dose to be adjusted by periodic monitoring of thyroid function tests.   - He did not take his propranolol and several weeks, advised him to stop.  -He is advised on smoking cessation. - I advised patient to maintain close follow up with Lenise Herald, PA-C for primary care needs.  Follow up plan: Return in about 8 weeks (around 02/20/2016) for follow up with pre-visit labs, underactive thyroid.  Marquis Lunch, MD Phone: [phone]  Fax: [phone]   12/26/2015, 9:20 AM

## 2016-02-14 ENCOUNTER — Other Ambulatory Visit: Payer: Self-pay

## 2016-02-14 MED ORDER — LEVOTHYROXINE SODIUM 100 MCG PO TABS: 100.0000 ug | ORAL_TABLET | Freq: Every day | ORAL | 2 refills | Status: DC

## 2016-02-15 ENCOUNTER — Other Ambulatory Visit: Payer: Self-pay

## 2016-02-15 MED ORDER — LEVOTHYROXINE SODIUM 100 MCG PO TABS: 100.0000 ug | ORAL_TABLET | Freq: Every day | ORAL | 1 refills | Status: DC

## 2016-02-24 ENCOUNTER — Ambulatory Visit: Payer: BLUE CROSS/BLUE SHIELD | Admitting: "Endocrinology

## 2017-12-11 DIAGNOSIS — F17210 Nicotine dependence, cigarettes, uncomplicated: Secondary | ICD-10-CM | POA: Insufficient documentation

## 2017-12-11 DIAGNOSIS — Z79899 Other long term (current) drug therapy: Secondary | ICD-10-CM | POA: Diagnosis not present

## 2017-12-11 DIAGNOSIS — R200 Anesthesia of skin: Secondary | ICD-10-CM | POA: Diagnosis not present

## 2017-12-11 DIAGNOSIS — E039 Hypothyroidism, unspecified: Secondary | ICD-10-CM | POA: Diagnosis not present

## 2017-12-11 DIAGNOSIS — R42 Dizziness and giddiness: Secondary | ICD-10-CM | POA: Diagnosis present

## 2017-12-12 ENCOUNTER — Observation Stay (HOSPITAL_COMMUNITY)
Admission: EM | Admit: 2017-12-12 | Discharge: 2017-12-13 | Disposition: A | Discharge status: Home / Self Care | Payer: BLUE CROSS/BLUE SHIELD | Attending: Internal Medicine | Admitting: Internal Medicine

## 2017-12-12 ENCOUNTER — Other Ambulatory Visit: Payer: Self-pay

## 2017-12-12 ENCOUNTER — Observation Stay (HOSPITAL_BASED_OUTPATIENT_CLINIC_OR_DEPARTMENT_OTHER): Payer: BLUE CROSS/BLUE SHIELD

## 2017-12-12 ENCOUNTER — Emergency Department (HOSPITAL_COMMUNITY): Payer: BLUE CROSS/BLUE SHIELD

## 2017-12-12 ENCOUNTER — Encounter (HOSPITAL_COMMUNITY): Payer: Self-pay

## 2017-12-12 ENCOUNTER — Observation Stay (HOSPITAL_COMMUNITY): Payer: BLUE CROSS/BLUE SHIELD

## 2017-12-12 DIAGNOSIS — R42 Dizziness and giddiness: Secondary | ICD-10-CM

## 2017-12-12 DIAGNOSIS — Z720 Tobacco use: Secondary | ICD-10-CM | POA: Diagnosis present

## 2017-12-12 DIAGNOSIS — R55 Syncope and collapse: Secondary | ICD-10-CM

## 2017-12-12 DIAGNOSIS — G459 Transient cerebral ischemic attack, unspecified: Secondary | ICD-10-CM | POA: Diagnosis not present

## 2017-12-12 DIAGNOSIS — E66813 Obesity, class 3: Secondary | ICD-10-CM | POA: Diagnosis present

## 2017-12-12 DIAGNOSIS — E890 Postprocedural hypothyroidism: Secondary | ICD-10-CM | POA: Diagnosis present

## 2017-12-12 DIAGNOSIS — R200 Anesthesia of skin: Secondary | ICD-10-CM

## 2017-12-12 DIAGNOSIS — R209 Unspecified disturbances of skin sensation: Secondary | ICD-10-CM

## 2017-12-12 HISTORY — DX: Disorder of thyroid, unspecified: E07.9

## 2017-12-12 HISTORY — DX: Morbid (severe) obesity due to excess calories: E66.01

## 2017-12-12 HISTORY — DX: Tobacco use: Z72.0

## 2017-12-12 HISTORY — DX: Obesity, class 3: E66.813

## 2017-12-12 LAB — RAPID URINE DRUG SCREEN, HOSP PERFORMED
Amphetamines: NOT DETECTED
Benzodiazepines: NOT DETECTED
Cocaine: NOT DETECTED
Opiates: NOT DETECTED
Tetrahydrocannabinol: NOT DETECTED

## 2017-12-12 LAB — CBC
HCT: 43.4 % (ref 39.0–52.0)
HCT: 44.9 % (ref 39.0–52.0)
HEMOGLOBIN: 14.1 g/dL (ref 13.0–17.0)
HEMOGLOBIN: 14.1 g/dL (ref 13.0–17.0)
MCH: 27.5 pg (ref 26.0–34.0)
MCH: 26.8 pg (ref 26.0–34.0)
MCHC: 32.5 g/dL (ref 30.0–36.0)
MCHC: 31.4 g/dL (ref 30.0–36.0)
MCV: 84.8 fL (ref 78.0–100.0)
MCV: 85.2 fL (ref 78.0–100.0)
PLATELETS: 197 10*3/uL (ref 150–400)
Platelets: 189 10*3/uL (ref 150–400)
RBC: 5.12 MIL/uL (ref 4.22–5.81)
RBC: 5.27 MIL/uL (ref 4.22–5.81)
RDW: 14 % (ref 11.5–15.5)
RDW: 14.1 % (ref 11.5–15.5)
WBC: 6.8 10*3/uL (ref 4.0–10.5)
WBC: 7.1 10*3/uL (ref 4.0–10.5)

## 2017-12-12 LAB — DIFFERENTIAL
Basophils Absolute: 0 10*3/uL (ref 0.0–0.1)
Basophils Relative: 0 %
EOS PCT: 5 %
Eosinophils Absolute: 0.3 10*3/uL (ref 0.0–0.7)
LYMPHS ABS: 3.4 10*3/uL (ref 0.7–4.0)
LYMPHS PCT: 50 %
MONO ABS: 0.6 10*3/uL (ref 0.1–1.0)
Monocytes Relative: 8 %
NEUTROS ABS: 2.5 10*3/uL (ref 1.7–7.7)
Neutrophils Relative %: 37 %

## 2017-12-12 LAB — COMPREHENSIVE METABOLIC PANEL
ALBUMIN: 3.9 g/dL (ref 3.5–5.0)
ALK PHOS: 45 U/L (ref 38–126)
ALK PHOS: 43 U/L (ref 38–126)
ALT: 31 U/L (ref 0–44)
ALT: 31 U/L (ref 0–44)
ANION GAP: 6 (ref 5–15)
ANION GAP: 7 (ref 5–15)
AST: 21 U/L (ref 15–41)
AST: 19 U/L (ref 15–41)
Albumin: 3.8 g/dL (ref 3.5–5.0)
BUN: 14 mg/dL (ref 6–20)
BUN: 13 mg/dL (ref 6–20)
CALCIUM: 8.8 mg/dL — AB (ref 8.9–10.3)
CALCIUM: 8.8 mg/dL — AB (ref 8.9–10.3)
CO2: 26 mmol/L (ref 22–32)
CO2: 26 mmol/L (ref 22–32)
CREATININE: 0.94 mg/dL (ref 0.61–1.24)
CREATININE: 1 mg/dL (ref 0.61–1.24)
Chloride: 107 mmol/L (ref 98–111)
Chloride: 107 mmol/L (ref 98–111)
GFR calc non Af Amer: >60 mL/min (ref >60)
GLUCOSE: 94 mg/dL (ref 70–99)
Glucose, Bld: 93 mg/dL (ref 70–99)
Potassium: 4.1 mmol/L (ref 3.5–5.1)
Potassium: 4 mmol/L (ref 3.5–5.1)
SODIUM: 139 mmol/L (ref 135–145)
SODIUM: 140 mmol/L (ref 135–145)
TOTAL PROTEIN: 6.9 g/dL (ref 6.5–8.1)
Total Bilirubin: 0.6 mg/dL (ref 0.3–1.2)
Total Bilirubin: 0.7 mg/dL (ref 0.3–1.2)
Total Protein: 6.7 g/dL (ref 6.5–8.1)

## 2017-12-12 LAB — URINALYSIS, ROUTINE W REFLEX MICROSCOPIC
Bilirubin Urine: NEGATIVE
Glucose, UA: NEGATIVE mg/dL
Hgb urine dipstick: NEGATIVE
Ketones, ur: NEGATIVE mg/dL
LEUKOCYTES UA: NEGATIVE
Nitrite: NEGATIVE
PROTEIN: NEGATIVE mg/dL
SPECIFIC GRAVITY, URINE: 1.026 (ref 1.005–1.030)
pH: 5 (ref 5.0–8.0)

## 2017-12-12 LAB — TROPONIN I

## 2017-12-12 LAB — APTT: aPTT: 29 seconds (ref 24–36)

## 2017-12-12 LAB — ECHOCARDIOGRAM COMPLETE
Height: 71 in
WEIGHTICAEL: 5093.51 [oz_av]

## 2017-12-12 LAB — TSH

## 2017-12-12 LAB — HEMOGLOBIN A1C
HEMOGLOBIN A1C: 5.6 % (ref 4.8–5.6)
MEAN PLASMA GLUCOSE: 114.02 mg/dL

## 2017-12-12 LAB — LIPID PANEL
CHOL/HDL RATIO: 4.6 ratio
CHOLESTEROL: 148 mg/dL (ref 0–200)
HDL: 32 mg/dL — AB (ref >40)
LDL Cholesterol: 100 mg/dL — ABNORMAL HIGH (ref 0–99)
TRIGLYCERIDES: 80 mg/dL (ref <150)
VLDL: 16 mg/dL (ref 0–40)

## 2017-12-12 LAB — PROTIME-INR
INR: 0.96
Prothrombin Time: 12.7 seconds (ref 11.4–15.2)

## 2017-12-12 LAB — ETHANOL: Alcohol, Ethyl (B): <10 mg/dL (ref <10)

## 2017-12-12 MED ORDER — KETOROLAC TROMETHAMINE 30 MG/ML IJ SOLN
30.0000 mg | Freq: Once | INTRAMUSCULAR | Status: AC
  Administered 2017-12-12: 30 mg via INTRAVENOUS
  Filled 2017-12-12: qty 1

## 2017-12-12 MED ORDER — METOCLOPRAMIDE HCL 5 MG/ML IJ SOLN
10.0000 mg | Freq: Once | INTRAMUSCULAR | Status: AC
  Administered 2017-12-12: 10 mg via INTRAVENOUS
  Filled 2017-12-12: qty 2

## 2017-12-12 MED ORDER — ONDANSETRON HCL 4 MG PO TABS: 4.0000 mg | ORAL_TABLET | Freq: Four times a day (QID) | ORAL | Status: DC | PRN

## 2017-12-12 MED ORDER — ONDANSETRON HCL 4 MG/2ML IJ SOLN: 4.0000 mg | Freq: Four times a day (QID) | INTRAMUSCULAR | Status: DC | PRN

## 2017-12-12 MED ORDER — ACETAMINOPHEN 325 MG PO TABS: 650.0000 mg | ORAL_TABLET | ORAL | Status: DC | PRN

## 2017-12-12 MED ORDER — STROKE: EARLY STAGES OF RECOVERY BOOK
Freq: Once | Status: AC
  Administered 2017-12-12: 1
  Filled 2017-12-12 (×2): qty 1

## 2017-12-12 MED ORDER — ENOXAPARIN SODIUM 40 MG/0.4ML ~~LOC~~ SOLN: 40.0000 mg | SUBCUTANEOUS | Status: DC

## 2017-12-12 MED ORDER — ENOXAPARIN SODIUM 80 MG/0.8ML ~~LOC~~ SOLN
0.5000 mg/kg | SUBCUTANEOUS | Status: DC
  Administered 2017-12-12 – 2017-12-13 (×2): 70 mg via SUBCUTANEOUS
  Filled 2017-12-12 (×3): qty 0.8

## 2017-12-12 MED ORDER — ACETAMINOPHEN 650 MG RE SUPP: 650.0000 mg | Freq: Four times a day (QID) | RECTAL | Status: DC | PRN

## 2017-12-12 MED ORDER — DIPHENHYDRAMINE HCL 50 MG/ML IJ SOLN
25.0000 mg | Freq: Once | INTRAMUSCULAR | Status: AC
  Administered 2017-12-12: 25 mg via INTRAVENOUS
  Filled 2017-12-12: qty 1

## 2017-12-12 MED ORDER — ACETAMINOPHEN 160 MG/5ML PO SOLN: 650.0000 mg | ORAL | Status: DC | PRN

## 2017-12-12 MED ORDER — PERFLUTREN LIPID MICROSPHERE
1.0000 mL | INTRAVENOUS | Status: AC | PRN
  Administered 2017-12-12: 1 mL via INTRAVENOUS
  Administered 2017-12-12: 2 mL via INTRAVENOUS
  Filled 2017-12-12: qty 10

## 2017-12-12 MED ORDER — ASPIRIN 325 MG PO TABS
325.0000 mg | ORAL_TABLET | Freq: Every day | ORAL | Status: DC
  Administered 2017-12-12 – 2017-12-13 (×2): 325 mg via ORAL
  Filled 2017-12-12 (×2): qty 1

## 2017-12-12 MED ORDER — ACETAMINOPHEN 325 MG PO TABS: 650.0000 mg | ORAL_TABLET | Freq: Four times a day (QID) | ORAL | Status: DC | PRN

## 2017-12-12 MED ORDER — ACETAMINOPHEN 650 MG RE SUPP: 650.0000 mg | RECTAL | Status: DC | PRN

## 2017-12-12 NOTE — Evaluation (Signed)
Occupational Therapy Evaluation Patient Details Name: Donald Rollins MRN: [id number] DOB: July 20, 1980 Today's Date: 12/12/2017   Patient screened by Occupational Therapy with no further acute OT needs identified. All education has been completed and the patient has no further questions. Patient demonstrating BUE A/roM and strength wnL.  Patient states he feels normal except for a dazed feeling and some numbness in the right side of his face.  He states he has been independent with ambulation this date and was independent with b/iadls yesterday and this am.   See below for any follow-up Occupational Therapy or equipment needs. OT is signing off. Thank you for this referral.                                                Shirlean MylarBethany H. Josealfredo Adkins, MHA, OTR/L [phone]  Donald CodeMurray, Donald Rollins 12/12/2017, 11:11 AM

## 2017-12-12 NOTE — Progress Notes (Signed)
SLP Cancellation Note  Patient Details Name: Donald Rollins MRN: [id number] DOB: [birth year]   Cancelled treatment:       Reason Eval/Treat Not Completed: Patient at procedure or test/unavailable; SLP unable to see Pt due to Pt receiving testing in room. Chart reviewed and it appears unlikely that Pt will need SLP services as his only complaint is some numbness to right side of face. MRI pending and won't be completed until tomorrow. SLP will check back following results of MRI.   Thank you,  Havery MorosDabney Porter, CCC-SLP [phone]    PORTER,DABNEY 12/12/2017, 12:07 PM

## 2017-12-12 NOTE — ED Notes (Signed)
Patient transported to CT 

## 2017-12-12 NOTE — ED Triage Notes (Addendum)
Pt states he was driving for work this morning when he had an episode of feeling dizzy, states felt like everything "got hazy" for about 15 seconds, states he pulled over and ate something and then went home approx 1230 pm.  Pt states he went to bed and got up to go back to work and states he still felt funny and wants to be checked for same. Pt also reports numbness to the right side of his face off and on since 1030 this am when his symptoms started

## 2017-12-12 NOTE — ED Provider Notes (Signed)
[facility] EMERGENCY DEPARTMENT Provider Note   CSN: [id number] Arrival date & time: 12/11/17  2351     History   Chief Complaint Chief Complaint  Patient presents with  . Dizziness    HPI Donald Rollins is a 37 y.o. male.  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  States after work this morning he was driving home when he began to feel dizzy sensation with numbness to the right side of his face and "hazy vision" and feeling like he was going to pass out. He had a pull over to the side of the road and rested for about 30 minutes.  He then felt improved and went home to go to sleep.  When he woke up to go to work this evening he still had numbness to the right side of his face with a feeling of "not feeling good."  He denies any dizziness or lightheadedness currently.  He states his vision is normal.  He denies headache.  No focal weakness to his arms or legs.  No difficulty speaking or difficulty swallowing.  Has had migraines in the past but not had any paresthesias from these.  He still feels like something is not quite right.  He denies any blurry vision or double vision now.  He denies feeling he is going to pass out at this time.  He continues to complain of numbness to the right side of his face and feeling funny.  No chest pain or shortness of breath. 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  The history is provided by the patient.  Dizziness  Associated symptoms: no chest pain, no headaches, no nausea, no shortness of breath and no vomiting     Past Medical History:  Diagnosis Date  . Thyroid disease     Patient Active Problem List   Diagnosis Date Noted  . Hypothyroidism following radioiodine therapy 07/15/2015    History reviewed. No pertinent surgical history.      Home Medications    Prior to Admission medications   Medication Sig Start Date End Date Taking? Authorizing Provider    levothyroxine (SYNTHROID, LEVOTHROID) 100 MCG tablet Take 1 tablet (100 mcg total) by mouth daily. 02/15/16   Roma Kayser, MD    Family History No family history on file.  Social History Social History   Tobacco Use  . Smoking status: Current Every Day Smoker    Packs/day: 0.50    Years: 10.00    Pack years: 5.00    Types: Cigarettes  . Smokeless tobacco: Never Used  Substance Use Topics  . Alcohol use: Yes  . Drug use: No     Allergies   Shellfish allergy   Review of Systems Review of Systems  Constitutional: Negative for activity change, appetite change and fever.  HENT: Negative for congestion and rhinorrhea.   Eyes: Positive for visual disturbance.  Respiratory: Negative for cough, chest tightness and shortness of breath.   Cardiovascular: Negative for chest pain and leg swelling.  Gastrointestinal: Negative for abdominal pain, nausea and vomiting.  Genitourinary: Negative for dysuria, flank pain, hematuria and testicular pain.  Musculoskeletal: Negative for arthralgias and myalgias.  Neurological: Positive for dizziness and light-headedness. Negative for headaches.   all other systems are negative except as noted in the HPI and PMH.     Physical Exam Updated Vital Signs BP 120/76   Pulse 68   Temp 98.4 F (36.9 C)  Resp 19   Ht 5\' 11"  (1.803 m)   Wt (!) 142 kg (313 lb)   SpO2 100%   BMI 43.65 kg/m   Physical Exam  Constitutional: He is oriented to person, place, and time. He appears well-developed and well-nourished. No distress.  HENT:  Head: Normocephalic and atraumatic.  Mouth/Throat: Oropharynx is clear and moist. No oropharyngeal exudate.  Eyes: Pupils are equal, round, and reactive to light. Conjunctivae and EOM are normal.  Neck: Normal range of motion. Neck supple.  No meningismus.  Cardiovascular: Normal rate, regular rhythm, normal heart sounds and intact distal pulses. Exam reveals no gallop.  No murmur heard. Pulmonary/Chest:  Effort normal and breath sounds normal. No respiratory distress.  Abdominal: Soft. There is no tenderness. There is no rebound and no guarding.  Musculoskeletal: Normal range of motion. He exhibits no edema or tenderness.  Neurological: He is alert and oriented to person, place, and time. No cranial nerve deficit. He exhibits normal muscle tone. Coordination normal.  CN 2-12 intact, no ataxia on finger to nose, no nystagmus, 5/5 strength throughout, no pronator drift, Romberg negative, normal gait. R sided facial subjective numbness.   Skin: Skin is warm.  Psychiatric: He has a normal mood and affect. His behavior is normal.  Nursing note and vitals reviewed.    ED Treatments / Results  Labs (all labs ordered are listed, but only abnormal results are displayed) Labs Reviewed  TSH - Abnormal; Notable for the following components:      Result Value   TSH <0.010 (*)    All other components within normal limits  COMPREHENSIVE METABOLIC PANEL - Abnormal; Notable for the following components:   Calcium 8.8 (*)    All other components within normal limits  RAPID URINE DRUG SCREEN, HOSP PERFORMED - Abnormal; Notable for the following components:   Barbiturates   (*)    Value: Result not available. Reagent lot number recalled by manufacturer.   All other components within normal limits  ETHANOL  PROTIME-INR  APTT  CBC  DIFFERENTIAL  URINALYSIS, ROUTINE W REFLEX MICROSCOPIC  TROPONIN I  T4, FREE  HIV ANTIBODY (ROUTINE TESTING)  HEMOGLOBIN A1C  COMPREHENSIVE METABOLIC PANEL  CBC  LIPID PANEL    EKG EKG Interpretation  Date/Time:  Thursday December 12 2017 00:19:00 EDT Ventricular Rate:  66 PR Interval:    QRS Duration: 105 QT Interval:  416 QTC Calculation: 436 R Axis:   -17 Text Interpretation:  Sinus rhythm Borderline left axis deviation Low voltage, precordial leads Borderline T abnormalities, inferior leads Nonspecific T wave abnormality Confirmed by Glynn Octaveancour, Annaclaire Walsworth [phone]([id number])  on 12/12/2017 12:39:08 AM   Radiology Ct Head Wo Contrast  Result Date: 12/12/2017 CLINICAL DATA:  Dizzy episode this morning.  Facial paresthesia. EXAM: CT HEAD WITHOUT CONTRAST TECHNIQUE: Contiguous axial images were obtained from the base of the skull through the vertex without intravenous contrast. COMPARISON:  12/23/2009 FINDINGS: Brain: No evidence of acute infarction, hemorrhage, hydrocephalus, extra-axial collection or mass lesion/mass effect. Vascular: No hyperdense vessel or unexpected calcification. Skull: Normal. Negative for fracture or focal lesion. Sinuses/Orbits: No acute finding. Other: None. IMPRESSION: No acute intracranial abnormalities. Electronically Signed   By: Burman NievesWilliam  Stevens M.D.   On: 12/12/2017 01:40    Procedures Procedures (including critical care time)  Medications Ordered in ED Medications - No data to display   Initial Impression / Assessment and Plan / ED Course  I have reviewed the triage vital signs and the nursing notes.  Pertinent labs &  imaging results that were available during my care of the patient were reviewed by me and considered in my medical decision making (see chart for details).    Right-sided facial numbness since approximately 10 AM with sensation of dizziness and near syncope which is since resolved.  Nonfocal neurological exam at this time.  Code stroke not activated due to delay in presentation and improving symptoms.  Orthostatics are negative.  CT head is negative.  Patient given migraine cocktail given concern for possible complex migraine  Labs are reassuring.  TSH is undetectable though patient states he has been noncompliant with his Synthroid.'  Discussed with teleneurology.  She recommends MRI to rule out small lacunar infarct.  Cannot label this complicated migraine at this time without motor weakness or headache.  Patient does not need emergent transfer tonight for MRI.  He is agreeable to observation admission.  Discussed  with Dr. Robb Matar.  Final Clinical Impressions(s) / ED Diagnoses   Final diagnoses:  Right facial numbness    ED Discharge Orders    None       Patrese Neal, Jeannett Senior, MD 12/12/17 [phone]

## 2017-12-12 NOTE — Progress Notes (Signed)
*  PRELIMINARY RESULTS* Echocardiogram 2D Echocardiogram has been performed with Definity.  Stacey DrainWhite, Jermika Olden J 12/12/2017, 11:30 AM

## 2017-12-12 NOTE — Progress Notes (Signed)
PT Cancellation Note  Patient Details Name: Donald Rollins MRN: [id number] DOB: 08-06-1980   Cancelled Treatment:    Reason Eval/Treat Not Completed: Patient at procedure or test/unavailable  Patient's nurse informed therapist that patient was having some different testing done. Therapist knocked on patient's door to check and see if patient was still having testing done and upon entering, patient was receiving an ultrasound. Therapist informed patient and other visitors that physical therapy would try again the following day.   Verne CarrowMacy Zyasia Halbleib PT, DPT 12:00 PM, 12/12/17 [phone]

## 2017-12-12 NOTE — H&P (Signed)
History and Physical:    Donald Rollins   EAV:[id number] DOB: 19-Sep-1980 DOA: 12/12/2017  Referring MD/provider: Glynn Octave, MD PCP: Shawnie Dapper, PA-C   Patient coming from: Home.  Chief Complaint: Dizziness  History of Present Illness:   Donald Rollins is an 37 y.o. male with a PMH of thyroid disease status post radioactive iodine, noncompliant with thyroid replacement treatment (says he didn't notice any difference in his symptoms while on it and therefore stopped taking it) and migraine headaches who presents for evaluation of dizziness associated with numbness to the right side of his face and visual changes which she describes as "hazy vision".  Felt presyncopal when driving, and had to pull off on the side of the road to rest for 30 minutes.  Went home to sleep, and awoke with ongoing numbness of the right side of his face.  No sedated headache or focal motor deficits.  No associated dysarthria or dysphasia.  No report of headache.  Of note, he recently started a keto diet for weight loss.  ED Course:  The patient noted to have a nonfocal neurological exam on presentation.  CT of the head was negative.  Thought to have a possible complex migraine and was treated with a migraine cocktail.  Teleneurologist recommended MRI, but unfortunately this is not currently available.  ROS:   Review of Systems  Constitutional: Negative.   HENT: Negative.   Eyes: Positive for blurred vision.  Respiratory: Negative.   Cardiovascular: Negative.   Gastrointestinal: Negative.   Genitourinary: Negative.   Musculoskeletal: Negative.   Skin: Negative.   Neurological: Positive for dizziness, sensory change and weakness.  Endo/Heme/Allergies: Negative.   Psychiatric/Behavioral: Negative.     Past Medical History:   Past Medical History:  Diagnosis Date  . Thyroid disease   . Tobacco abuse     Past Surgical History:   History reviewed. No pertinent surgical  history.  Social History:   Social History   Socioeconomic History  . Marital status: Single    Spouse name: Not on file  . Number of children: Not on file  . Years of education: Not on file  . Highest education level: Not on file  Occupational History  . Occupation: Truck Runner, broadcasting/film/video  . Financial resource strain: Not on file  . Food insecurity:    Worry: Not on file    Inability: Not on file  . Transportation needs:    Medical: Not on file    Non-medical: Not on file  Tobacco Use  . Smoking status: Current Every Day Smoker    Packs/day: 0.50    Years: 10.00    Pack years: 5.00    Types: Cigarettes  . Smokeless tobacco: Never Used  Substance and Sexual Activity  . Alcohol use: Yes    Comment: Socially  . Drug use: No  . Sexual activity: Yes  Lifestyle  . Physical activity:    Days per week: Not on file    Minutes per session: Not on file  . Stress: Not on file  Relationships  . Social connections:    Talks on phone: Not on file    Gets together: Not on file    Attends religious service: Not on file    Active member of club or organization: Not on file    Attends meetings of clubs or organizations: Not on file    Relationship status: Not on file  . Intimate partner violence:  Fear of current or ex partner: Not on file    Emotionally abused: Not on file    Physically abused: Not on file    Forced sexual activity: Not on file  Other Topics Concern  . Not on file  Social History Narrative   Works as a Naval architecttruck driver, single. Independent of ADLs and mobility.    Allergies   Shellfish allergy  Family history:   Family History  Problem Relation Age of Onset  . Asthma Mother     Current Medications:   Prior to Admission medications   Medication Sig Start Date End Date Taking? Authorizing Provider  levothyroxine (SYNTHROID, LEVOTHROID) 100 MCG tablet Take 1 tablet (100 mcg total) by mouth daily. 02/15/16   Roma KayserNida, Gebreselassie W, MD    Physical  Exam:   Vitals:   12/12/17 0342 12/12/17 0455 12/12/17 0541 12/12/17 0743  BP: 124/78  132/73 102/75  Pulse: 60  (!) 58 (!) 57  Resp: 20  18 18   Temp: 97.7 F (36.5 C)  97.9 F (36.6 C) 98.4 F (36.9 C)  TempSrc: Oral  Oral Oral  SpO2: 100%  98% 97%  Weight:  (!) 144.4 kg (318 lb 5.5 oz)    Height:  5\' 11"  (1.803 m)       Physical Exam: Blood pressure 102/75, pulse (!) 57, temperature 98.4 F (36.9 C), temperature source Oral, resp. rate 18, height 5\' 11"  (1.803 m), weight (!) 144.4 kg (318 lb 5.5 oz), SpO2 97 %. Gen: No acute distress. Obese. Head: Normocephalic, atraumatic. Eyes: Pupils equal, round and reactive to light. Extraocular movements intact.  Sclerae nonicteric. No lid lag. Mouth: Oropharynx reveals moist mucous membranes. Dentition is good. Neck: Supple, no thyromegaly, no lymphadenopathy, no jugular venous distention. Chest: Lungs are clear to auscultation with good air movement. No rales, rhonchi or wheezes.  CV: Heart sounds are regular with an S1, S2. No murmurs, rubs, clicks, or gallops.  Abdomen: Soft, nontender, nondistended with normal active bowel sounds. No hepatosplenomegaly or palpable masses. Extremities: Extremities are without clubbing, or cyanosis. No edema. Pedal pulses 2+.  Skin: Warm and dry. No rashes, lesions or wounds. Neuro: Alert and oriented times 3; grossly nonfocal. 4/4 strength. CN II-XII grossly intact. No pronator drift. Psych: Insight is good and judgment is impaired. Mood and affect normal.   Data Review:    Labs: Basic Metabolic Panel: Recent Labs  Lab 12/12/17 0115 12/12/17 0445  NA 139 140  K 4.1 4.0  CL 107 107  CO2 26 26  GLUCOSE 94 93  BUN 14 13  CREATININE 0.94 1.00  CALCIUM 8.8* 8.8*   Liver Function Tests: Recent Labs  Lab 12/12/17 0115 12/12/17 0445  AST 21 19  ALT 31 31  ALKPHOS 45 43  BILITOT 0.6 0.7  PROT 6.9 6.7  ALBUMIN 3.9 3.8   CBC: Recent Labs  Lab 12/12/17 0115 12/12/17 0445  WBC 6.8  7.1  NEUTROABS 2.5  --   HGB 14.1 14.1  HCT 43.4 44.9  MCV 84.8 85.2  PLT 197 189   Cardiac Enzymes: Recent Labs  Lab 12/12/17 0115  TROPONINI <0.03    Urinalysis    Component Value Date/Time   COLORURINE YELLOW 12/12/2017 0300   APPEARANCEUR CLEAR 12/12/2017 0300   LABSPEC 1.026 12/12/2017 0300   PHURINE 5.0 12/12/2017 0300   GLUCOSEU NEGATIVE 12/12/2017 0300   HGBUR NEGATIVE 12/12/2017 0300   BILIRUBINUR NEGATIVE 12/12/2017 0300   KETONESUR NEGATIVE 12/12/2017 0300   PROTEINUR NEGATIVE 12/12/2017  0300   UROBILINOGEN 0.2 12/23/2009 2300   NITRITE NEGATIVE 12/12/2017 0300   LEUKOCYTESUR NEGATIVE 12/12/2017 0300      Radiographic Studies: Ct Head Wo Contrast  Result Date: 12/12/2017 CLINICAL DATA:  Dizzy episode this morning.  Facial paresthesia. EXAM: CT HEAD WITHOUT CONTRAST TECHNIQUE: Contiguous axial images were obtained from the base of the skull through the vertex without intravenous contrast. COMPARISON:  12/23/2009 FINDINGS: Brain: No evidence of acute infarction, hemorrhage, hydrocephalus, extra-axial collection or mass lesion/mass effect. Vascular: No hyperdense vessel or unexpected calcification. Skull: Normal. Negative for fracture or focal lesion. Sinuses/Orbits: No acute finding. Other: None. IMPRESSION: No acute intracranial abnormalities. Electronically Signed   By: Burman Nieves M.D.   On: 12/12/2017 01:40    EKG: Independently reviewed. NSR, T wave abnormalities inferior leads.   Assessment/Plan:   Principal Problem:   TIA (transient ischemic attack)/dizziness OBS admit to r/o stroke. Right facial paresthesias improved after migraine cocktail. CT personally reviewed, no acute findings. Check FLP, hemoglobin A1c. Start ASA 325 mg daily. Differential includes complex migraine, which has been treated. MRI ordered for tomorrow (not available today). PT/OT/ST ordered. Tobacco cessation encouraged. UDS negative. Check orthostatics.  Active Problems:    Hypothyroidism following radioiodine therapy Non-compliant with synthroid. TSH suppressed, free T4 pending.    Tobacco abuse Has been on Chantix. Smokes about 1/4-1/2 PPD, down from 1 PPD. Counseled.    Obesity with BMI 44.4 Attempting to lose weight.  HIV screening The patient falls between the ages of 13-64 and should be screened for HIV, therefore HIV testing ordered.   Other information:   DVT prophylaxis: Lovenox ordered. Code Status: Full code. Family Communication: No family at the bedside. Disposition Plan: Likely home tomorrow if MRI negative. Consults called: Teleneurologist by EDP. Admission status: OBS.   Trula Ore Rama [facility] Pager [phone] Cell: [phone]   If 7PM-7AM, please contact night-coverage [email or web address] Password [email or web address] 12/12/2017, 8:57 AM

## 2017-12-13 ENCOUNTER — Observation Stay (HOSPITAL_COMMUNITY): Payer: BLUE CROSS/BLUE SHIELD

## 2017-12-13 DIAGNOSIS — Z720 Tobacco use: Secondary | ICD-10-CM

## 2017-12-13 DIAGNOSIS — R209 Unspecified disturbances of skin sensation: Secondary | ICD-10-CM | POA: Diagnosis not present

## 2017-12-13 DIAGNOSIS — G459 Transient cerebral ischemic attack, unspecified: Secondary | ICD-10-CM

## 2017-12-13 DIAGNOSIS — R200 Anesthesia of skin: Secondary | ICD-10-CM

## 2017-12-13 DIAGNOSIS — E890 Postprocedural hypothyroidism: Secondary | ICD-10-CM | POA: Diagnosis not present

## 2017-12-13 LAB — HIV ANTIBODY (ROUTINE TESTING W REFLEX): HIV Screen 4th Generation wRfx: NONREACTIVE

## 2017-12-13 LAB — T4, FREE: FREE T4: 1.05 ng/dL (ref 0.82–1.77)

## 2017-12-13 MED ORDER — ASPIRIN 81 MG PO CHEW: 81.0000 mg | CHEWABLE_TABLET | Freq: Every day | ORAL | Status: DC

## 2017-12-13 NOTE — Progress Notes (Signed)
Late Entry  Patient went into second degree heart block type one, dropped one QRS. Patient asymptomatic. Vitals stable. MD aware. Will continue to monitor.

## 2017-12-13 NOTE — Evaluation (Signed)
Physical Therapy Evaluation Patient Details Name: Donald EkeJamison R Schmader MRN: [id number] DOB: March 23, 1981 Today's Date: 12/13/2017   Patient screened by Physical Therapy with no further acute PT needs identified. All education has been completed and the patient has no further questions. Patient demonstrating independent ambulation without assistive devices. No longer feels dazed but continues to feel some numbness on the right side of his face. Was able to turn clockwise and counterclockwise in a circle without LOB. No follow-up Physical Therapy or equipment needs identified. PT is signing off. Thank you for this referral.  Katina DungBarbara D. Hartnett-Rands, MS, PT [place] [facility] [phone] 12/13/2017, 10:48 AM

## 2017-12-13 NOTE — Progress Notes (Signed)
IV removed, 2x2 gauze and paper tape applied to site, patient tolerated well.  Reviewed AVS/ discharge information with patient and patient's wife, both verbalized understanding.  Patient transported home by wife.

## 2017-12-13 NOTE — Discharge Summary (Signed)
Physician Discharge Summary  Donald Rollins ZOX:[id number] DOB: 02-01-1981 DOA: 12/12/2017  PCP: Shawnie Dapper, PA-C  Admit date: 12/12/2017 Discharge date: 12/13/2017  Admitted From: Home Disposition:  Home   Recommendations for Outpatient Follow-up:  1. Follow up with PCP in 1-2 weeks 2. Please obtain BMP/CBC in one week 3. Follow up with endocrine, Dr. Fransico Him in one week    Discharge Condition: Stable CODE STATUS:FULL Diet recommendation: Heart Healthy / Carb Modified   Brief/Interim Summary: 37 year old male with a history of hyperthyroidism status post radioactive iodine treatment, migraine headaches, tobacco abuse presented with heaviness and numbness to his right face.  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  He described his vision becoming hazy.  He felt presyncopal.  He put off to saw the road and rested for 30 minutes.  The patient went home to go to sleep.  When he woke up he continued to have numbness and tingling and heaviness in the right side of his face.  The patient denied any headaches or focal motor deficits.  There is no associated dysarthria, dysphasia, or focal extremity weakness. Of note, he recently started a keto diet for weight loss.  Patient was admitted to the hospital for stroke work-up. MRI and MRA of the brain  was unremarkable for acute findings.  The patient continued to have some intermittent heaviness about the right side of his face during the hospitalization, but was overall improved with migraine cocktail.  Neurology consultation was offered.  However the patient wanted to be discharged and follow-up with neurology as an outpatient.  Discharge Diagnoses:   Right Facial Dysesthesia -low suspicion of stroke/tia- -pt did not want to wait for inpatient neurology to consult -outpt neurology referral--?complicated migraine vs trigeminal neuralgia -PT/OT evaluation -Speech therapy eval -CT  brain--neg  -MRI brain--neg -MRA brain--neg -Carotid Duplex--neg -Echo--EF 55-60%n no WMA, no PFO -LDL--100 -HbA1C--5.6 -Antiplatelet--ASA 81 mg daily   Active Problems:   Hypothyroidism following radioiodine therapy -now hyperthyroid again -Non-compliant with synthroid.  -TSH suppressed (off levothyroxine), free T4 pending -has not taken levothyroxine x 6 months -follow up with Endocrine-- Dr. Fransico Him in one week    Tobacco abuse Has been on Chantix. Smokes about 1/4-1/2 PPD, down from 1 PPD.  -I have discussed tobacco cessation with the patient.  I have counseled the patient regarding the negative impacts of continued tobacco use including but not limited to lung cancer, COPD, and cardiovascular disease.  I have discussed alternatives to tobacco and modalities that may help facilitate tobacco cessation including but not limited to biofeedback, hypnosis, and medications.  Total time spent with tobacco counseling was 4 minutes.     Obesity with BMI 44.4 Attempting to lose weight.  HIV screening The patient falls between the ages of 13-64 and should be screened for HIV, therefore HIV testing ordered.     Discharge Instructions  Discharge Instructions    Ambulatory referral to Neurology   Complete by:  As directed    Refer to [facility]--Dr. Jaffe--?trigeminal neuralgia; right facial dyesthesia     Allergies as of 12/13/2017      Reactions   Shellfish Allergy Swelling   Facial swelling      Medication List    STOP taking these medications   levothyroxine 100 MCG tablet Commonly known as:  SYNTHROID, LEVOTHROID   multivitamin with minerals Tabs tablet   naproxen sodium 220 MG tablet Commonly known as:  ALEVE  TAKE these medications   aspirin 81 MG chewable tablet Chew 1 tablet (81 mg total) by mouth daily.   CHANTIX STARTING MONTH PAK 0.5 MG X 11 & 1 MG X 42 tablet Generic drug:  varenicline See admin instructions. see package      Follow-up  Information    Nida, Denman George, MD In 1 week.   Specialty:  Endocrinology Contact information: [address] [place] [address] [phone]          Allergies  Allergen Reactions  . Shellfish Allergy Swelling    Facial swelling    Consultations:  none   Procedures/Studies: Ct Head Wo Contrast  Result Date: 12/12/2017 CLINICAL DATA:  Dizzy episode this morning.  Facial paresthesia. EXAM: CT HEAD WITHOUT CONTRAST TECHNIQUE: Contiguous axial images were obtained from the base of the skull through the vertex without intravenous contrast. COMPARISON:  12/23/2009 FINDINGS: Brain: No evidence of acute infarction, hemorrhage, hydrocephalus, extra-axial collection or mass lesion/mass effect. Vascular: No hyperdense vessel or unexpected calcification. Skull: Normal. Negative for fracture or focal lesion. Sinuses/Orbits: No acute finding. Other: None. IMPRESSION: No acute intracranial abnormalities. Electronically Signed   By: Burman Nieves M.D.   On: 12/12/2017 01:40   Mr Brain Wo Contrast  Result Date: 12/13/2017 CLINICAL DATA:  Dizziness and facial numbness. EXAM: MRI HEAD WITHOUT CONTRAST MRA HEAD WITHOUT CONTRAST TECHNIQUE: Multiplanar, multiecho pulse sequences of the brain and surrounding structures were obtained without intravenous contrast. Angiographic images of the head were obtained using MRA technique without contrast. COMPARISON:  CT 12/12/2017 FINDINGS: MRI HEAD FINDINGS Brain: Diffusion imaging does not show any acute or subacute infarction. Brainstem and cerebellum are normal. Cerebral hemispheres are normal except for a few punctate foci of T2 and FLAIR signal in the left frontal white matter, not likely significant. No cortical abnormality. No mass lesion, hemorrhage, hydrocephalus or extra-axial collection. Incidental arachnoid herniation into the sella. This can be normal or can be seen with intracranial hypertension. Vascular: Major vessels at the base of the  brain show flow. Skull and upper cervical spine: Negative Sinuses/Orbits: Clear/normal. Insignificant retention cyst left maxillary sinus. Other: None MRA HEAD FINDINGS Both internal carotid arteries are widely patent into the brain. No siphon stenosis. The anterior and middle cerebral vessels are patent without proximal stenosis, aneurysm or vascular malformation. Both vertebral arteries are widely patent to the basilar. No basilar stenosis. Posterior circulation branch vessels appear normal. IMPRESSION: Normal intracranial MR angiography of the large and medium size vessels. No significant brain parenchymal finding. No cause of the presenting symptoms is identified. Few punctate white matter foci in the left frontal lobe, probably not significant. Arachnoid herniation into the sella, which can be an normal variant or can be seen with intracranial hypertension. Electronically Signed   By: Paulina Fusi M.D.   On: 12/13/2017 08:54   US Carotid Bilateral (at [facility] And [facility] Only)  Result Date: 12/12/2017 CLINICAL DATA:  TIA.  History of smoking. EXAM: BILATERAL CAROTID DUPLEX ULTRASOUND TECHNIQUE: Wallace Cullens scale imaging, color Doppler and duplex ultrasound were performed of bilateral carotid and vertebral arteries in the neck. COMPARISON:  None. FINDINGS: Criteria: Quantification of carotid stenosis is based on velocity parameters that correlate the residual internal carotid diameter with NASCET-based stenosis levels, using the diameter of the distal internal carotid lumen as the denominator for stenosis measurement. The following velocity measurements were obtained: RIGHT ICA:  96/46 cm/sec CCA:  98/26 cm/sec SYSTOLIC ICA/CCA RATIO:  1.0 ECA:  65 cm/sec LEFT ICA:  92/35  cm/sec CCA:  126/24 cm/sec SYSTOLIC ICA/CCA RATIO:  0.7 ECA:  78 cm/sec RIGHT CAROTID ARTERY: There is no grayscale evidence of significant intimal thickening or atherosclerotic plaque affecting the interrogated portions of the right carotid system. There  are no elevated peak systolic velocities within the interrogated course of the right internal carotid artery to suggest a hemodynamically significant stenosis. RIGHT VERTEBRAL ARTERY:  Antegrade flow LEFT CAROTID ARTERY: There is no grayscale evidence of significant intimal thickening or atherosclerotic plaque affecting the interrogated portions of the left carotid system. There are no elevated peak systolic velocities within the interrogated course of the left internal carotid artery to suggest a hemodynamically significant stenosis. LEFT VERTEBRAL ARTERY:  Antegrade Flow IMPRESSION: Normal carotid Doppler ultrasound. Electronically Signed   By: Simonne Come M.D.   On: 12/12/2017 13:29   Mr Maxine Glenn Head/brain ZO Cm  Result Date: 12/13/2017 CLINICAL DATA:  Dizziness and facial numbness. EXAM: MRI HEAD WITHOUT CONTRAST MRA HEAD WITHOUT CONTRAST TECHNIQUE: Multiplanar, multiecho pulse sequences of the brain and surrounding structures were obtained without intravenous contrast. Angiographic images of the head were obtained using MRA technique without contrast. COMPARISON:  CT 12/12/2017 FINDINGS: MRI HEAD FINDINGS Brain: Diffusion imaging does not show any acute or subacute infarction. Brainstem and cerebellum are normal. Cerebral hemispheres are normal except for a few punctate foci of T2 and FLAIR signal in the left frontal white matter, not likely significant. No cortical abnormality. No mass lesion, hemorrhage, hydrocephalus or extra-axial collection. Incidental arachnoid herniation into the sella. This can be normal or can be seen with intracranial hypertension. Vascular: Major vessels at the base of the brain show flow. Skull and upper cervical spine: Negative Sinuses/Orbits: Clear/normal. Insignificant retention cyst left maxillary sinus. Other: None MRA HEAD FINDINGS Both internal carotid arteries are widely patent into the brain. No siphon stenosis. The anterior and middle cerebral vessels are patent without  proximal stenosis, aneurysm or vascular malformation. Both vertebral arteries are widely patent to the basilar. No basilar stenosis. Posterior circulation branch vessels appear normal. IMPRESSION: Normal intracranial MR angiography of the large and medium size vessels. No significant brain parenchymal finding. No cause of the presenting symptoms is identified. Few punctate white matter foci in the left frontal lobe, probably not significant. Arachnoid herniation into the sella, which can be an normal variant or can be seen with intracranial hypertension. Electronically Signed   By: Paulina Fusi M.D.   On: 12/13/2017 08:54        Discharge Exam: Vitals:   12/13/17 0541 12/13/17 0740  BP: 120/73 104/85  Pulse: 60 60  Resp: 14 16  Temp:  98.5 F (36.9 C)  SpO2: 100% 99%   Vitals:   12/12/17 2340 12/13/17 0339 12/13/17 0541 12/13/17 0740  BP: 131/82 107/68 120/73 104/85  Pulse: (!) 57 (!) 56 60 60  Resp:   14 16  Temp: 98.2 F (36.8 C) 98.3 F (36.8 C)  98.5 F (36.9 C)  TempSrc: Oral Oral  Oral  SpO2: 100% 97% 100% 99%  Weight:      Height:        General: Pt is alert, awake, not in acute distress Cardiovascular: RRR, S1/S2 +, no rubs, no gallops Respiratory: CTA bilaterally, no wheezing, no rhonchi Abdominal: Soft, NT, ND, bowel sounds + Extremities: no edema, no cyanosis   The results of significant diagnostics from this hospitalization (including imaging, microbiology, ancillary and laboratory) are listed below for reference.    Significant Diagnostic Studies: Ct Head Wo Contrast  Result Date: 12/12/2017 CLINICAL DATA:  Dizzy episode this morning.  Facial paresthesia. EXAM: CT HEAD WITHOUT CONTRAST TECHNIQUE: Contiguous axial images were obtained from the base of the skull through the vertex without intravenous contrast. COMPARISON:  12/23/2009 FINDINGS: Brain: No evidence of acute infarction, hemorrhage, hydrocephalus, extra-axial collection or mass lesion/mass effect.  Vascular: No hyperdense vessel or unexpected calcification. Skull: Normal. Negative for fracture or focal lesion. Sinuses/Orbits: No acute finding. Other: None. IMPRESSION: No acute intracranial abnormalities. Electronically Signed   By: Burman Nieves M.D.   On: 12/12/2017 01:40   Mr Brain Wo Contrast  Result Date: 12/13/2017 CLINICAL DATA:  Dizziness and facial numbness. EXAM: MRI HEAD WITHOUT CONTRAST MRA HEAD WITHOUT CONTRAST TECHNIQUE: Multiplanar, multiecho pulse sequences of the brain and surrounding structures were obtained without intravenous contrast. Angiographic images of the head were obtained using MRA technique without contrast. COMPARISON:  CT 12/12/2017 FINDINGS: MRI HEAD FINDINGS Brain: Diffusion imaging does not show any acute or subacute infarction. Brainstem and cerebellum are normal. Cerebral hemispheres are normal except for a few punctate foci of T2 and FLAIR signal in the left frontal white matter, not likely significant. No cortical abnormality. No mass lesion, hemorrhage, hydrocephalus or extra-axial collection. Incidental arachnoid herniation into the sella. This can be normal or can be seen with intracranial hypertension. Vascular: Major vessels at the base of the brain show flow. Skull and upper cervical spine: Negative Sinuses/Orbits: Clear/normal. Insignificant retention cyst left maxillary sinus. Other: None MRA HEAD FINDINGS Both internal carotid arteries are widely patent into the brain. No siphon stenosis. The anterior and middle cerebral vessels are patent without proximal stenosis, aneurysm or vascular malformation. Both vertebral arteries are widely patent to the basilar. No basilar stenosis. Posterior circulation branch vessels appear normal. IMPRESSION: Normal intracranial MR angiography of the large and medium size vessels. No significant brain parenchymal finding. No cause of the presenting symptoms is identified. Few punctate white matter foci in the left frontal  lobe, probably not significant. Arachnoid herniation into the sella, which can be an normal variant or can be seen with intracranial hypertension. Electronically Signed   By: Paulina Fusi M.D.   On: 12/13/2017 08:54   US Carotid Bilateral (at [facility] And Ap Only)  Result Date: 12/12/2017 CLINICAL DATA:  TIA.  History of smoking. EXAM: BILATERAL CAROTID DUPLEX ULTRASOUND TECHNIQUE: Wallace Cullens scale imaging, color Doppler and duplex ultrasound were performed of bilateral carotid and vertebral arteries in the neck. COMPARISON:  None. FINDINGS: Criteria: Quantification of carotid stenosis is based on velocity parameters that correlate the residual internal carotid diameter with NASCET-based stenosis levels, using the diameter of the distal internal carotid lumen as the denominator for stenosis measurement. The following velocity measurements were obtained: RIGHT ICA:  96/46 cm/sec CCA:  98/26 cm/sec SYSTOLIC ICA/CCA RATIO:  1.0 ECA:  65 cm/sec LEFT ICA:  92/35 cm/sec CCA:  126/24 cm/sec SYSTOLIC ICA/CCA RATIO:  0.7 ECA:  78 cm/sec RIGHT CAROTID ARTERY: There is no grayscale evidence of significant intimal thickening or atherosclerotic plaque affecting the interrogated portions of the right carotid system. There are no elevated peak systolic velocities within the interrogated course of the right internal carotid artery to suggest a hemodynamically significant stenosis. RIGHT VERTEBRAL ARTERY:  Antegrade flow LEFT CAROTID ARTERY: There is no grayscale evidence of significant intimal thickening or atherosclerotic plaque affecting the interrogated portions of the left carotid system. There are no elevated peak systolic velocities within the interrogated course of the left internal carotid artery to suggest a hemodynamically significant  stenosis. LEFT VERTEBRAL ARTERY:  Antegrade Flow IMPRESSION: Normal carotid Doppler ultrasound. Electronically Signed   By: Simonne ComeJohn  Watts M.D.   On: 12/12/2017 13:29   Mr Maxine GlennMra Head/brain JXWo  Cm  Result Date: 12/13/2017 CLINICAL DATA:  Dizziness and facial numbness. EXAM: MRI HEAD WITHOUT CONTRAST MRA HEAD WITHOUT CONTRAST TECHNIQUE: Multiplanar, multiecho pulse sequences of the brain and surrounding structures were obtained without intravenous contrast. Angiographic images of the head were obtained using MRA technique without contrast. COMPARISON:  CT 12/12/2017 FINDINGS: MRI HEAD FINDINGS Brain: Diffusion imaging does not show any acute or subacute infarction. Brainstem and cerebellum are normal. Cerebral hemispheres are normal except for a few punctate foci of T2 and FLAIR signal in the left frontal white matter, not likely significant. No cortical abnormality. No mass lesion, hemorrhage, hydrocephalus or extra-axial collection. Incidental arachnoid herniation into the sella. This can be normal or can be seen with intracranial hypertension. Vascular: Major vessels at the base of the brain show flow. Skull and upper cervical spine: Negative Sinuses/Orbits: Clear/normal. Insignificant retention cyst left maxillary sinus. Other: None MRA HEAD FINDINGS Both internal carotid arteries are widely patent into the brain. No siphon stenosis. The anterior and middle cerebral vessels are patent without proximal stenosis, aneurysm or vascular malformation. Both vertebral arteries are widely patent to the basilar. No basilar stenosis. Posterior circulation branch vessels appear normal. IMPRESSION: Normal intracranial MR angiography of the large and medium size vessels. No significant brain parenchymal finding. No cause of the presenting symptoms is identified. Few punctate white matter foci in the left frontal lobe, probably not significant. Arachnoid herniation into the sella, which can be an normal variant or can be seen with intracranial hypertension. Electronically Signed   By: Paulina FusiMark  Shogry M.D.   On: 12/13/2017 08:54     Microbiology: No results found for this or any previous visit (from the past 240  hour(s)).   Labs: Basic Metabolic Panel: Recent Labs  Lab 12/12/17 0115 12/12/17 0445  NA 139 140  K 4.1 4.0  CL 107 107  CO2 26 26  GLUCOSE 94 93  BUN 14 13  CREATININE 0.94 1.00  CALCIUM 8.8* 8.8*   Liver Function Tests: Recent Labs  Lab 12/12/17 0115 12/12/17 0445  AST 21 19  ALT 31 31  ALKPHOS 45 43  BILITOT 0.6 0.7  PROT 6.9 6.7  ALBUMIN 3.9 3.8   No results for input(s): LIPASE, AMYLASE in the last 168 hours. No results for input(s): AMMONIA in the last 168 hours. CBC: Recent Labs  Lab 12/12/17 0115 12/12/17 0445  WBC 6.8 7.1  NEUTROABS 2.5  --   HGB 14.1 14.1  HCT 43.4 44.9  MCV 84.8 85.2  PLT 197 189   Cardiac Enzymes: Recent Labs  Lab 12/12/17 0115  TROPONINI <0.03   BNP: Invalid input(s): POCBNP CBG: No results for input(s): GLUCAP in the last 168 hours.  Time coordinating discharge:  36 minutes  Signed:  Catarina Hartshornavid Eliyahu Bille, DO [facility] Pager: [phone] 12/13/2017, 10:29 AM

## 2017-12-16 ENCOUNTER — Encounter: Payer: Self-pay | Admitting: Neurology

## 2017-12-16 ENCOUNTER — Ambulatory Visit: Payer: BLUE CROSS/BLUE SHIELD | Admitting: Neurology

## 2017-12-16 VITALS — BP 128/76 | HR 98 | Ht 71.0 in | Wt 320.0 lb

## 2017-12-16 DIAGNOSIS — R200 Anesthesia of skin: Secondary | ICD-10-CM | POA: Diagnosis not present

## 2017-12-16 DIAGNOSIS — F17200 Nicotine dependence, unspecified, uncomplicated: Secondary | ICD-10-CM | POA: Diagnosis not present

## 2017-12-16 DIAGNOSIS — R55 Syncope and collapse: Secondary | ICD-10-CM | POA: Diagnosis not present

## 2017-12-16 MED ORDER — PREDNISONE 10 MG (21) PO TBPK: ORAL_TABLET | ORAL | 0 refills | Status: DC

## 2017-12-16 NOTE — Patient Instructions (Addendum)
You may be having a migraine. 1.  To help break this facial pressure/numbness, take prednisone taper.  Take 6tabs x1day, then 5tabs x1day, then 4tabs x1day, then 3tabs x1day, then 2tabs x1day, then 1tab x1day, then STOP 2.  If head/facial pressure persists, contact me and we can start a daily migraine preventative.  Then I would have you follow up with me. 3.  Follow up with endocrinology 4.  Follow up with your PCP regarding your near-passing out spells. 5.  Continue working on quitting smoking. 6.  As I don't think you had a stroke or TIA, you may discontinue the aspirin

## 2017-12-16 NOTE — Progress Notes (Signed)
NEUROLOGY CONSULTATION NOTE  Donald Rollins MRN: [id number] DOB: September 27, 1980  Referring provider: Catarina Hartshornavid Tat, DO (hospital referral) Primary care provider: Lenise HeraldBenjamin Mann, PA-C  Reason for consult:  Right facial numbness/pressure  HISTORY OF PRESENT ILLNESS: Donald Rollins is a 37 year old right-handed male with hyperthyroidism status post radioactive iodine treatment and migraines who presents for right facial numbness.  History supplemented by hospital notes.  MRI/MRA of head personally reviewed.  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  He pulled over to rest for a few minutes.  He then developed numbness of the right side of his face in the V2 distribution with associated 2-3/10 non-throbbing heaviness of the right side of his face (behind the eye and maxilla) and head.  There was no associated headache, visual disturbance, nausea, vomiting, photophobia, phonophobia, or unilateral numbness or weakness of his body.  He subsequently went home to sleep.  When he woke up, he still had the symptoms.  He presented to the ED at [facility] on 12/12/17 and was admitted for stroke workup.  MRI of brain showed few punctate T2/FLAIR foci in the left frontal white matter, but no acute findings.  Sinuses did not demonstrate noticeable sinusitis.  MRA of head showed no large vessel stenosis or occlusion.  TTE showed EF 55-60%.  Carotid doppler showed no hemodynamically significant stenosis.  Hgb A1c was 5.6.  LDL was 100.  Urine drug screen was negative.  HIV was negative.  TSH was less than 0.010 but free T4 was 1.05.  He was advised to start ASA 81mg  daily and discharged.  Symptoms resolved on the afternoon of 7/5.  However, it recurred the following afternoon and has not resolved.  He reports change in health since he was diagnosed with hyperthyroidism.  He underwent radioactive iodine treatment.  He has not followed up with  endocrinology for awhile.  He reports recurrent episodes of lightheadedness, in which he feels like he will pass out.  It usually occurs if he is laughing or coughing really hard.   He does report past history of migraines, described as bilateral frontal pounding headache with no associated nausea, vomiting, photophobia or phonophobia.  They last occurred over a year ago.   He is a smoker but is now actively trying to quit.  He is on Chantix and is down to about 4 cigarettes a day.  PAST MEDICAL HISTORY: Past Medical History:  Diagnosis Date  . Obesity, Class III, BMI 40-49.9 (morbid obesity) (HCC) 12/12/2017  . Thyroid disease   . Tobacco abuse     PAST SURGICAL HISTORY: Past Surgical History:  Procedure Laterality Date  . NO PAST SURGERIES      MEDICATIONS: Current Outpatient Medications on File Prior to Visit  Medication Sig Dispense Refill  . CHANTIX STARTING MONTH PAK 0.5 MG X 11 & 1 MG X 42 tablet See admin instructions. see package  0   No current facility-administered medications on file prior to visit.     ALLERGIES: No Known Allergies  FAMILY HISTORY: Family History  Problem Relation Age of Onset  . Asthma Mother   . Lupus Sister     SOCIAL HISTORY: Social History   Socioeconomic History  . Marital status: Single    Spouse name: Not on file  . Number of children: Not on file  . Years of education: Not on file  . Highest education level: Not on file  Occupational History  . Occupation: Truck Runner, broadcasting/film/video  . Financial resource strain: Not on file  . Food insecurity:    Worry: Not on file    Inability: Not on file  . Transportation needs:    Medical: Not on file    Non-medical: Not on file  Tobacco Use  . Smoking status: Current Every Day Smoker    Packs/day: 0.50    Years: 10.00    Pack years: 5.00    Types: Cigarettes  . Smokeless tobacco: Never Used  Substance and Sexual Activity  . Alcohol use: Yes    Comment: Socially (every couple  months)  . Drug use: No  . Sexual activity: Yes  Lifestyle  . Physical activity:    Days per week: Not on file    Minutes per session: Not on file  . Stress: Not on file  Relationships  . Social connections:    Talks on phone: Not on file    Gets together: Not on file    Attends religious service: Not on file    Active member of club or organization: Not on file    Attends meetings of clubs or organizations: Not on file    Relationship status: Not on file  . Intimate partner violence:    Fear of current or ex partner: Not on file    Emotionally abused: Not on file    Physically abused: Not on file    Forced sexual activity: Not on file  Other Topics Concern  . Not on file  Social History Narrative   Works as a Naval architect, single. Independent of ADLs and mobility.    REVIEW OF SYSTEMS: Constitutional: No fevers, chills, or sweats, no generalized fatigue, change in appetite Eyes: No visual changes, double vision, eye pain Ear, nose and throat: No hearing loss, ear pain, nasal congestion, sore throat Cardiovascular: No chest pain, palpitations Respiratory:  No shortness of breath at rest or with exertion, wheezes GastrointestinaI: No nausea, vomiting, diarrhea, abdominal pain, fecal incontinence Genitourinary:  No dysuria, urinary retention or frequency Musculoskeletal:  No neck pain, back pain Integumentary: No rash, pruritus, skin lesions Neurological: as above Psychiatric: No depression, insomnia, anxiety Endocrine: No palpitations, fatigue, diaphoresis, mood swings, change in appetite, change in weight, increased thirst Hematologic/Lymphatic:  No purpura, petechiae. Allergic/Immunologic: no itchy/runny eyes, nasal congestion, recent allergic reactions, rashes  PHYSICAL EXAM: Vitals:   12/16/17 1256  BP: 128/76  Pulse: 98  SpO2: 96%   General: No acute distress.  Patient appears well-groomed.  Head:  Normocephalic/atraumatic Eyes:  fundi examined but not  visualized Neck: supple, no paraspinal tenderness, full range of motion Back: No paraspinal tenderness Heart: regular rate and rhythm Lungs: Clear to auscultation bilaterally. Vascular: No carotid bruits. Neurological Exam: Mental status: alert and oriented to person, place, and time, recent and remote memory intact, fund of knowledge intact, attention and concentration intact, speech fluent and not dysarthric, language intact. Cranial nerves: CN I: not tested CN II: pupils equal, round and reactive to light, visual fields intact CN III, IV, VI:  full range of motion, no nystagmus, no ptosis CN V: decreased right V2. CN VII: upper and lower face symmetric CN VIII: hearing intact CN IX, X: gag intact, uvula midline CN XI: sternocleidomastoid and trapezius muscles intact CN XII: tongue midline Bulk & Tone: normal, no fasciculations. Motor:  5/5 throughout  Sensation: temperature and vibration sensation intact. Deep Tendon Reflexes:  2+ throughout, toes downgoing.  Finger to nose testing:  Without dysmetria.  Heel to shin:  Without dysmetria.  Gait:  Normal station and stride.  Able to turn and tandem walk. Romberg negative.  IMPRESSION: 1.  Right facial numbness/tingling.  Unclear etiology.  There does not appear to be sinusitis on MRI.  Atypical migraine is possible. 2.  Vasovagal near-syncope 3.  Tobacco use disorder 4.  Morbid obesity (BMI 44.63 kg/m2)  PLAN: 1.  To help break this facial pressure/numbness, take prednisone taper.  Take 6tabs x1day, then 5tabs x1day, then 4tabs x1day, then 3tabs x1day, then 2tabs x1day, then 1tab x1day, then STOP 2.  If head/facial pressure persists, he is to contact me and we can start a daily migraine preventative.  Then I would have im follow up with me. 3.  Follow up with endocrinology 4.  Advised to follow up with his PCP regarding your near-passing out spells. 5.  As I don't think he had a stroke or TIA, he may discontinue the aspirin 6.   Weight loss 7.  Tobacco cessation counseling (CPT 99406):  Tobacco use with no history of CAD, stroke, or cancer  - Currently smoking 4 to 5 cigarettes/day   - Patient was informed of the dangers of tobacco abuse including stroke, cancer, and MI, as well as benefits of tobacco cessation. - Patient is willing to quit at this time (taking Chantix). - Approximately 5 mins were spent counseling patient cessation techniques. We discussed various methods to help quit smoking, including deciding on a date to quit, joining a support group, pharmacological agents- nicotine gum/patch/lozenges, chantix.  - If follow up visit is needed, then I will reassess his progress.   Thank you for allowing me to take part in the care of this patient.  Shon Millet, DO  CC:  Lenise Herald, PA-C

## 2017-12-18 ENCOUNTER — Ambulatory Visit: Payer: BLUE CROSS/BLUE SHIELD | Admitting: "Endocrinology

## 2017-12-18 ENCOUNTER — Encounter: Payer: Self-pay | Admitting: "Endocrinology

## 2017-12-18 VITALS — BP 132/86 | HR 89 | Ht 71.0 in | Wt 319.0 lb

## 2017-12-18 DIAGNOSIS — E890 Postprocedural hypothyroidism: Secondary | ICD-10-CM

## 2017-12-18 DIAGNOSIS — Z91199 Patient's noncompliance with other medical treatment and regimen due to unspecified reason: Secondary | ICD-10-CM

## 2017-12-18 DIAGNOSIS — Z720 Tobacco use: Secondary | ICD-10-CM | POA: Diagnosis not present

## 2017-12-18 DIAGNOSIS — Z9119 Patient's noncompliance with other medical treatment and regimen: Secondary | ICD-10-CM | POA: Diagnosis not present

## 2017-12-18 MED ORDER — LEVOTHYROXINE SODIUM 75 MCG PO TABS: 75.0000 ug | ORAL_TABLET | Freq: Every day | ORAL | 3 refills | Status: DC

## 2017-12-18 NOTE — Progress Notes (Signed)
Subjective:    Patient ID: Donald Rollins, male    DOB: 04-Sep-1980, PCP Donald Rollins, Donald L, PA-C.   Past Medical History:  Diagnosis Date  . Obesity, Class III, BMI 40-49.9 (morbid obesity) (HCC) 12/12/2017  . Thyroid disease   . Tobacco abuse    Past Surgical History:  Procedure Laterality Date  . NO PAST SURGERIES     Social History   Socioeconomic History  . Marital status: Single    Spouse name: Not on file  . Number of children: Not on file  . Years of education: Not on file  . Highest education level: Not on file  Occupational History  . Occupation: Truck Runner, broadcasting/film/videodriver  Social Needs  . Financial resource strain: Not on file  . Food insecurity:    Worry: Not on file    Inability: Not on file  . Transportation needs:    Medical: Not on file    Non-medical: Not on file  Tobacco Use  . Smoking status: Current Every Day Smoker    Packs/day: 0.50    Years: 10.00    Pack years: 5.00    Types: Cigarettes  . Smokeless tobacco: Never Used  Substance and Sexual Activity  . Alcohol use: Yes    Comment: Socially (every couple months)  . Drug use: No  . Sexual activity: Yes  Lifestyle  . Physical activity:    Days per week: Not on file    Minutes per session: Not on file  . Stress: Not on file  Relationships  . Social connections:    Talks on phone: Not on file    Gets together: Not on file    Attends religious service: Not on file    Active member of club or organization: Not on file    Attends meetings of clubs or organizations: Not on file    Relationship status: Not on file  Other Topics Concern  . Not on file  Social History Narrative   Works as a Naval architecttruck driver, single. Independent of ADLs and mobility.   Outpatient Encounter Medications as of 12/18/2017  Medication Sig  . CHANTIX STARTING MONTH PAK 0.5 MG X 11 & 1 MG X 42 tablet See admin instructions. see package  . levothyroxine (SYNTHROID, LEVOTHROID) 75 MCG tablet Take 1 tablet (75 mcg total) by mouth  daily before breakfast.  . methylPREDNISolone (MEDROL DOSEPAK) 4 MG TBPK tablet Take by mouth.  . [DISCONTINUED] predniSONE (STERAPRED UNI-PAK 21 TAB) 10 MG (21) TBPK tablet As directed, 6,5,4,3,2,1   No facility-administered encounter medications on file as of 12/18/2017.    ALLERGIES: No Known Allergies VACCINATION STATUS: Immunization History  Administered Date(s) Administered  . DTaP 10/31/2011    HPI  The patient presents today with Repeat thyroid function tests.  He is status post radioactive iodine therapy for Graves' disease on August 26, 2014.  He was initiated on thyroid hormone replacement since July 2017.  He was supposed to return for follow-up with repeat thyroid function test.  Unfortunately he did not show up for his appointments.  Tragically, he is not taking his thyroid hormone replacement saying that he " took it for months but has not seen any significant change".  He has gained 20+ pounds, complains of fatigue, cold intolerance, constipation.  -His previous symptoms of palpitations, heat intolerance, sweating, and tremors have subsided. The patient denies family history of thyroid dysfunction ,and the patient denies personal history of goiter. Patient is not on any anti-thyroid medications nor  on any thyroid hormone supplements.   Review of Systems  Constitutional:  gained  20+ pounds, + fatigue, +subjective hypothermia Eyes: no blurry vision, no xerophthalmia ENT: no sore throat, no nodules palpated in throat, no dysphagia/odynophagia, no hoarseness Cardiovascular: no CP/SOB/palpitations/leg swelling Respiratory: no cough/SOB Gastrointestinal: +  constipation  Musculoskeletal: no muscle/joint aches Skin: no rashes Neurological: + tremors,  Psychiatric: no depression/anxiety  Objective:    BP 132/86   Pulse 89   Ht 5\' 11"  (1.803 m)   Wt (!) 319 lb (144.7 kg)   BMI 44.49 kg/m   Wt Readings from Last 3 Encounters:  12/18/17 (!) 319 lb (144.7 kg)  12/16/17  (!) 320 lb (145.2 kg)  12/12/17 (!) 318 lb 5.5 oz (144.4 kg)    Physical Exam   Constitutional: + Obese, not in acute distress.  Eyes: PERRLA, EOMI, no exophthalmos ENT: moist mucous membranes, no thyromegaly, no cervical lymphadenopathy Cardiovascular: RRR, No MRG Respiratory: CTA B Gastrointestinal: abdomen soft, NT, ND, BS+ Musculoskeletal: no deformities, strength intact in all 4 Skin: moist, warm, no rashes Neurological:  - tremor with outstretched hands   Results for Donald Rollins, Donald Rollins (MRN [id number]) as of 12/19/2017 08:37  Ref. Range 08/04/2015 15:06 12/20/2015 15:09 12/12/2017 01:15  TSH Latest Ref Range: 0.350 - 4.500 uIU/mL <0.01 (Rollins) 8.29 (H) <0.010 (Rollins)  Triiodothyronine,Free,Serum Latest Ref Range: 2.3 - 4.2 pg/mL >20.0 (H)    T4,Free(Direct) Latest Ref Range: 0.82 - 1.77 ng/dL 3.6 (H) 0.7 (Rollins) 4.09  Thyroglobulin Ab Latest Ref Range: <2 IU/mL <1    Thyroperoxidase Ab SerPl-aCnc Latest Ref Range: <9 IU/mL 111 (H)      On 06/23/2015 his TSH was suppressed below 0.006  Assessment & Plan:   1. RAI hypothyroidism   -Patient did not compliant with his thyroid hormone replacement nor his follow-ups.   -I had a long discussion about the absolute necessity for him to take thyroid hormone replacement and keep his follow-up appointments.   -I will restart his levothyroxine at 75 mcg p.o. every morning.    - We discussed about correct intake of levothyroxine, at fasting, with water, separated by at least 30 minutes from breakfast, and separated by more than 4 hours from calcium, iron, multivitamins, acid reflux medications (PPIs). -Patient is made aware of the fact that thyroid hormone replacement is needed for life, dose to be adjusted by periodic monitoring of thyroid function tests.  -He is advised on smoking cessation. - I advised patient to maintain close follow up with Donald Dapper, PA-C for primary care needs.  Follow up plan: Return in about 3 months (around  03/20/2018) for follow up with pre-visit labs.  Donald Lunch, MD Phone: [phone]  Fax: [phone]   12/18/2017, 1:31 PM

## 2017-12-19 DIAGNOSIS — Z91199 Patient's noncompliance with other medical treatment and regimen due to unspecified reason: Secondary | ICD-10-CM | POA: Insufficient documentation

## 2017-12-19 DIAGNOSIS — Z9119 Patient's noncompliance with other medical treatment and regimen: Secondary | ICD-10-CM | POA: Insufficient documentation

## 2018-01-15 ENCOUNTER — Encounter: Payer: Self-pay | Admitting: Endocrinology

## 2018-03-21 ENCOUNTER — Ambulatory Visit: Payer: BLUE CROSS/BLUE SHIELD | Admitting: "Endocrinology

## 2018-05-21 ENCOUNTER — Other Ambulatory Visit: Payer: Self-pay

## 2018-05-21 ENCOUNTER — Emergency Department (HOSPITAL_COMMUNITY)
Admission: EM | Admit: 2018-05-21 | Discharge: 2018-05-21 | Disposition: A | Discharge status: Home / Self Care | Payer: BLUE CROSS/BLUE SHIELD | Attending: Emergency Medicine | Admitting: Emergency Medicine

## 2018-05-21 ENCOUNTER — Emergency Department (HOSPITAL_COMMUNITY): Payer: BLUE CROSS/BLUE SHIELD

## 2018-05-21 ENCOUNTER — Encounter (HOSPITAL_COMMUNITY): Payer: Self-pay | Admitting: Emergency Medicine

## 2018-05-21 DIAGNOSIS — F17210 Nicotine dependence, cigarettes, uncomplicated: Secondary | ICD-10-CM | POA: Diagnosis not present

## 2018-05-21 DIAGNOSIS — R091 Pleurisy: Secondary | ICD-10-CM | POA: Diagnosis not present

## 2018-05-21 DIAGNOSIS — R079 Chest pain, unspecified: Secondary | ICD-10-CM | POA: Diagnosis present

## 2018-05-21 DIAGNOSIS — Z79899 Other long term (current) drug therapy: Secondary | ICD-10-CM | POA: Diagnosis not present

## 2018-05-21 DIAGNOSIS — E02 Subclinical iodine-deficiency hypothyroidism: Secondary | ICD-10-CM | POA: Diagnosis not present

## 2018-05-21 LAB — HEPATIC FUNCTION PANEL
ALBUMIN: 4.3 g/dL (ref 3.5–5.0)
ALT: 35 U/L (ref 0–44)
AST: 25 U/L (ref 15–41)
Alkaline Phosphatase: 56 U/L (ref 38–126)
BILIRUBIN TOTAL: 0.5 mg/dL (ref 0.3–1.2)
Bilirubin, Direct: <0.1 mg/dL (ref 0.0–0.2)
TOTAL PROTEIN: 7.8 g/dL (ref 6.5–8.1)

## 2018-05-21 LAB — BASIC METABOLIC PANEL
Anion gap: 8 (ref 5–15)
BUN: 18 mg/dL (ref 6–20)
CO2: 24 mmol/L (ref 22–32)
Calcium: 9.3 mg/dL (ref 8.9–10.3)
Chloride: 106 mmol/L (ref 98–111)
Creatinine, Ser: 1.1 mg/dL (ref 0.61–1.24)
GFR calc Af Amer: >60 mL/min (ref >60)
GFR calc non Af Amer: >60 mL/min (ref >60)
GLUCOSE: 106 mg/dL — AB (ref 70–99)
Potassium: 3.9 mmol/L (ref 3.5–5.1)
Sodium: 138 mmol/L (ref 135–145)

## 2018-05-21 LAB — CBC WITH DIFFERENTIAL/PLATELET
Abs Immature Granulocytes: 0.01 10*3/uL (ref 0.00–0.07)
Basophils Absolute: 0 10*3/uL (ref 0.0–0.1)
Basophils Relative: 0 %
Eosinophils Absolute: 0.2 10*3/uL (ref 0.0–0.5)
Eosinophils Relative: 3 %
HCT: 48.7 % (ref 39.0–52.0)
Hemoglobin: 15 g/dL (ref 13.0–17.0)
IMMATURE GRANULOCYTES: 0 %
Lymphocytes Relative: 35 %
Lymphs Abs: 2.6 10*3/uL (ref 0.7–4.0)
MCH: 26.3 pg (ref 26.0–34.0)
MCHC: 30.8 g/dL (ref 30.0–36.0)
MCV: 85.3 fL (ref 80.0–100.0)
MONO ABS: 0.6 10*3/uL (ref 0.1–1.0)
Monocytes Relative: 8 %
NRBC: 0 % (ref 0.0–0.2)
Neutro Abs: 4 10*3/uL (ref 1.7–7.7)
Neutrophils Relative %: 54 %
PLATELETS: 225 10*3/uL (ref 150–400)
RBC: 5.71 MIL/uL (ref 4.22–5.81)
RDW: 13.8 % (ref 11.5–15.5)
WBC: 7.4 10*3/uL (ref 4.0–10.5)

## 2018-05-21 LAB — TROPONIN I

## 2018-05-21 LAB — D-DIMER, QUANTITATIVE (NOT AT ARMC)

## 2018-05-21 LAB — LIPASE, BLOOD: LIPASE: 25 U/L (ref 11–51)

## 2018-05-21 MED ORDER — NAPROXEN 500 MG PO TABS: 500.0000 mg | ORAL_TABLET | Freq: Two times a day (BID) | ORAL | 0 refills | Status: DC

## 2018-05-21 MED ORDER — KETOROLAC TROMETHAMINE 30 MG/ML IJ SOLN
30.0000 mg | Freq: Once | INTRAMUSCULAR | Status: AC
  Administered 2018-05-21: 30 mg via INTRAVENOUS
  Filled 2018-05-21: qty 1

## 2018-05-21 MED ORDER — IOPAMIDOL (ISOVUE-370) INJECTION 76%
100.0000 mL | Freq: Once | INTRAVENOUS | Status: AC | PRN
  Administered 2018-05-21: 100 mL via INTRAVENOUS

## 2018-05-21 NOTE — Discharge Instructions (Signed)
Your testing is reassuring.  There is no evidence of heart attack or blood clot in the lung.  No pneumonia, broken rib or collapsed lung. Take the anti-inflammatory as prescribed.  Take it with food.  Follow-up with your doctor for further evaluation.  Return to the ED with worsening symptoms including chest pain, shortness of breath or other concerns.

## 2018-05-21 NOTE — ED Notes (Signed)
ED Provider at bedside. 

## 2018-05-21 NOTE — ED Triage Notes (Signed)
Pt c/o bilateral rib pain that has gotten worse tonight. Pt has seen pcp for the same. Pt states when he walk a while he gets sob.

## 2018-05-21 NOTE — ED Notes (Signed)
Pt states he was scheduled for a CT Scan this week by his PCP and has not heard any further regarding his bilateral lower ribcage pain. Pt presents today to ED seeking answers for cause of his pain.

## 2018-05-21 NOTE — ED Provider Notes (Signed)
[facility] EMERGENCY DEPARTMENT Provider Note   CSN: [id number] Arrival date & time: 05/21/18  0354     History   Chief Complaint Chief Complaint  Patient presents with  . Chest Pain    HPI Donald Rollins is a 37 y.o. male.  Patient with a history of obesity and hypothyroidism presenting with right-sided rib pain has been progressively worsening since last night.  States he has had this pain frequently over the past week that comes and goes.  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  Aspirin did not help.  The pain is worse with palpation.  No pain with breathing.  He states he has had this pain on the right side intermittently over the past week.  He is also had intermittent left-sided pain in a similar distribution but none currently.  The pain lasts for several hours at a time.  Denies any cough or fever.  Denies abdominal pain, nausea or vomiting.  Denies any leg pain or leg swelling.  He saw his PCP for this earlier this week and was going to be scheduled for a CT scan but does not know what type.  Reports he was told in the past that he had a "tumor in his chest" but did not show up on subsequent scans.  Comes in tonight because the pain is more severe is been bothering him all night.  Denies any cardiac history.  The history is provided by the patient.  Chest Pain   Pertinent negatives include no abdominal pain, no back pain, no dizziness, no headaches, no shortness of breath and no weakness.    Past Medical History:  Diagnosis Date  . Obesity, Class III, BMI 40-49.9 (morbid obesity) (HCC) 12/12/2017  . Thyroid disease   . Tobacco abuse     Patient Active Problem List   Diagnosis Date Noted  . Personal history of noncompliance with medical treatment, presenting hazards to health 12/19/2017  . Sensory disturbance 12/13/2017  . Right facial numbness   . TIA (transient ischemic  attack) 12/12/2017  . Dizziness 12/12/2017  . Tobacco abuse 12/12/2017  . Obesity, Class III, BMI 40-49.9 (morbid obesity) (HCC) 12/12/2017  . Hypothyroidism following radioiodine therapy 07/15/2015    Past Surgical History:  Procedure Laterality Date  . NO PAST SURGERIES          Home Medications    Prior to Admission medications   Medication Sig Start Date End Date Taking? Authorizing Provider  CHANTIX STARTING MONTH PAK 0.5 MG X 11 & 1 MG X 42 tablet See admin instructions. see package 10/23/17   [provider]  levothyroxine (SYNTHROID, LEVOTHROID) 75 MCG tablet Take 1 tablet (75 mcg total) by mouth daily before breakfast. 12/18/17   Nida, Denman GeorgeGebreselassie W, MD  methylPREDNISolone (MEDROL DOSEPAK) 4 MG TBPK tablet Take by mouth.    [provider]    Family History Family History  Problem Relation Age of Onset  . Asthma Mother   . Lupus Sister     Social History Social History   Tobacco Use  . Smoking status: Current Every Day Smoker    Packs/day: 0.50    Years: 10.00    Pack years: 5.00    Types: Cigarettes  . Smokeless tobacco: Never Used  Substance Use Topics  . Alcohol use: Yes    Comment: Socially (every couple months)  . Drug use: No  Allergies   Patient has no known allergies.   Review of Systems Review of Systems  Constitutional: Negative for activity change and appetite change.  HENT: Negative for congestion and sore throat.   Eyes: Negative for visual disturbance.  Respiratory: Positive for chest tightness. Negative for shortness of breath.   Cardiovascular: Positive for chest pain.  Gastrointestinal: Negative for abdominal pain.  Genitourinary: Negative for dysuria and hematuria.  Musculoskeletal: Negative for arthralgias, back pain and myalgias.  Neurological: Negative for dizziness, weakness and headaches.   all other systems are negative except as noted in the HPI and PMH.     Physical Exam Updated Vital Signs BP  119/71 (BP Location: Left Arm)   Pulse (!) 59   Temp 98 F (36.7 C) (Oral)   Resp 16   Ht 5\' 11"  (1.803 m)   Wt (!) 147 kg   SpO2 100%   BMI 45.19 kg/m   Physical Exam  Constitutional: He is oriented to person, place, and time. He appears well-developed and well-nourished. No distress.  HENT:  Head: Normocephalic and atraumatic.  Mouth/Throat: Oropharynx is clear and moist. No oropharyngeal exudate.  Eyes: Pupils are equal, round, and reactive to light. Conjunctivae and EOM are normal.  Neck: Normal range of motion. Neck supple.  No meningismus.  Cardiovascular: Normal rate, regular rhythm, normal heart sounds and intact distal pulses.  No murmur heard. Pulmonary/Chest: Effort normal and breath sounds normal. No respiratory distress. He exhibits tenderness.  R lateral rib tenderness. No rash  Abdominal: Soft. There is no tenderness. There is no rebound and no guarding.  Obese abdomen. Soft. No RUQ tenderness  Musculoskeletal: Normal range of motion. He exhibits no edema or tenderness.  Neurological: He is alert and oriented to person, place, and time. No cranial nerve deficit. He exhibits normal muscle tone. Coordination normal.  No ataxia on finger to nose bilaterally. No pronator drift. 5/5 strength throughout. CN 2-12 intact.Equal grip strength. Sensation intact.   Skin: Skin is warm. Capillary refill takes less than 2 seconds. No rash noted.  Psychiatric: He has a normal mood and affect. His behavior is normal.  Nursing note and vitals reviewed.    ED Treatments / Results  Labs (all labs ordered are listed, but only abnormal results are displayed) Labs Reviewed  BASIC METABOLIC PANEL - Abnormal; Notable for the following components:      Result Value   Glucose, Bld 106 (*)    All other components within normal limits  CBC WITH DIFFERENTIAL/PLATELET  TROPONIN I  D-DIMER, QUANTITATIVE (NOT AT [facility])  HEPATIC FUNCTION PANEL  LIPASE, BLOOD    EKG EKG  Interpretation  Date/Time:  Wednesday May 21 2018 04:34:27 EST Ventricular Rate:  76 PR Interval:    QRS Duration: 88 QT Interval:  380 QTC Calculation: 428 R Axis:   -27 Text Interpretation:  Sinus rhythm Borderline left axis deviation Borderline T abnormalities, inferior leads Borderline ST elevation, lateral leads No significant change was found Confirmed by Glynn Octave [phone]) on 05/21/2018 4:39:43 AM   Radiology Dg Chest 2 View  Result Date: 05/21/2018 CLINICAL DATA:  37 y/o  M; right-sided rib pain. EXAM: CHEST - 2 VIEW COMPARISON:  12/05/2011 chest radiograph FINDINGS: Stable heart size and mediastinal contours are within normal limits. Both lungs are clear. No acute osseous abnormality is evident. IMPRESSION: No acute pulmonary process identified.  No fracture identified. Electronically Signed   By: Mitzi Hansen M.D.   On: 05/21/2018 05:34   Ct Angio Chest  Pe W And/or Wo Contrast  Result Date: 05/21/2018 CLINICAL DATA:  37 y/o M; bilateral rib pain and shortness of breath. EXAM: CT ANGIOGRAPHY CHEST WITH CONTRAST TECHNIQUE: Multidetector CT imaging of the chest was performed using the standard protocol during bolus administration of intravenous contrast. Multiplanar CT image reconstructions and MIPs were obtained to evaluate the vascular anatomy. CONTRAST:  ISOVUE-370 IOPAMIDOL (ISOVUE-370) INJECTION 76% COMPARISON:  05/21/2018 chest radiograph FINDINGS: Cardiovascular: Satisfactory opacification of the pulmonary arteries to the segmental level. Respiratory motion artifact. No evidence of pulmonary embolism identified. Normal heart size. No pericardial effusion. Mediastinum/Nodes: No enlarged mediastinal, hilar, or axillary lymph nodes. Thyroid gland, trachea, and esophagus demonstrate no significant findings. Lungs/Pleura: No consolidation, effusion, or pneumothorax. Upper Abdomen: No acute abnormality. Musculoskeletal: No chest wall abnormality. No acute or  significant osseous findings. Review of the MIP images confirms the above findings. IMPRESSION: Respiratory motion artifact. No pulmonary embolus identified. Unremarkable CTA of the chest. Electronically Signed   By: Mitzi Hansen M.D.   On: 05/21/2018 06:32    Procedures Procedures (including critical care time)  Medications Ordered in ED Medications  ketorolac (TORADOL) 30 MG/ML injection 30 mg (has no administration in time range)     Initial Impression / Assessment and Plan / ED Course  I have reviewed the triage vital signs and the nursing notes.  Pertinent labs & imaging results that were available during my care of the patient were reviewed by me and considered in my medical decision making (see chart for details).    Intermittent right lateral back pain for the past 1 week.  Occasionally has pain on left side as well.  Ongoing pain since about 10:45 PM last night.  EKG is unchanged nonspecific T wave inversions  Chest x-ray negative.  No pneumothorax or rib fracture.  Labs are reassuring with negative troponin and d-dimer.  Pain improved with Toradol.  Low suspicion for ACS.  PE considered given patient's sedentary job and obesity with pleuritic pain.  D-dimer however is negative.  Discussed with patient this is very reassuring but there is still a 1 or 2% chance that pulmonary embolism is still present. He agrees to proceed with CT scan.  This is negative.  No PE or other acute pathology. Patient reassured.  We will treat with anti-inflammatories for suspected pleurisy.  No abdominal pain, nausea or vomiting.  No right upper quadrant pain to suggest any gallbladder pathology. Follow up with PCP. Return precautions discussed.   Final Clinical Impressions(s) / ED Diagnoses   Final diagnoses:  Pleurisy    ED Discharge Orders    None       Joniah Bednarski, Jeannett Senior, MD 05/21/18 [phone]

## 2018-09-19 ENCOUNTER — Other Ambulatory Visit: Payer: Self-pay | Admitting: "Endocrinology

## 2018-10-05 ENCOUNTER — Other Ambulatory Visit: Payer: Self-pay | Admitting: "Endocrinology

## 2018-10-14 ENCOUNTER — Other Ambulatory Visit: Payer: Self-pay | Admitting: "Endocrinology

## 2018-12-18 ENCOUNTER — Ambulatory Visit (INDEPENDENT_AMBULATORY_CARE_PROVIDER_SITE_OTHER)
Admission: EM | Admit: 2018-12-18 | Discharge: 2018-12-18 | Disposition: A | Discharge status: Home / Self Care | Payer: BC Managed Care – PPO | Source: Home / Self Care

## 2018-12-18 ENCOUNTER — Other Ambulatory Visit: Payer: Self-pay

## 2018-12-18 ENCOUNTER — Emergency Department (HOSPITAL_COMMUNITY): Payer: BC Managed Care – PPO

## 2018-12-18 ENCOUNTER — Encounter (HOSPITAL_COMMUNITY): Payer: Self-pay | Admitting: Emergency Medicine

## 2018-12-18 ENCOUNTER — Emergency Department (HOSPITAL_COMMUNITY)
Admission: EM | Admit: 2018-12-18 | Discharge: 2018-12-18 | Disposition: A | Discharge status: Home / Self Care | Payer: BC Managed Care – PPO | Attending: Emergency Medicine | Admitting: Emergency Medicine

## 2018-12-18 DIAGNOSIS — Z8673 Personal history of transient ischemic attack (TIA), and cerebral infarction without residual deficits: Secondary | ICD-10-CM | POA: Insufficient documentation

## 2018-12-18 DIAGNOSIS — E039 Hypothyroidism, unspecified: Secondary | ICD-10-CM | POA: Insufficient documentation

## 2018-12-18 DIAGNOSIS — R079 Chest pain, unspecified: Secondary | ICD-10-CM

## 2018-12-18 DIAGNOSIS — R0789 Other chest pain: Secondary | ICD-10-CM | POA: Insufficient documentation

## 2018-12-18 DIAGNOSIS — F17210 Nicotine dependence, cigarettes, uncomplicated: Secondary | ICD-10-CM | POA: Insufficient documentation

## 2018-12-18 DIAGNOSIS — R9431 Abnormal electrocardiogram [ECG] [EKG]: Secondary | ICD-10-CM

## 2018-12-18 NOTE — ED Provider Notes (Signed)
[facility]ENCY [facility]PARTMENT Provider Note   CSN: [id number] Arrival date & time: 12/18/18  1719     History   Chief Complaint Chief Complaint  Patient presents with  . Chest Pain    HPI Donald Rollins is a 38 y.o. male.     Patient complains of pain near his site for when he bends over.  He had an EKG done at the urgent care and they were concerned about some ST changes so they sent him to the emergency department.  Patient is not having any pain that would be indicative of angina  The history is provided by the patient.  Chest Pain Pain location:  Substernal area Pain quality: aching   Pain radiates to:  Does not radiate Pain severity:  Mild Onset quality:  Sudden Timing:  Intermittent Progression:  Waxing and waning Chronicity:  New Context: not breathing   Associated symptoms: no abdominal pain, no back pain, no cough, no fatigue and no headache     Past Medical History:  Diagnosis Date  . Obesity, Class III, BMI 40-49.9 (morbid obesity) (HCC) 12/12/2017  . Thyroid disease   . Tobacco abuse     Patient Active Problem List   Diagnosis Date Noted  . Personal history of noncompliance with medical treatment, presenting hazards to health 12/19/2017  . Sensory disturbance 12/13/2017  . Right facial numbness   . TIA (transient ischemic attack) 12/12/2017  . Dizziness 12/12/2017  . Tobacco abuse 12/12/2017  . Obesity, Class III, BMI 40-49.9 (morbid obesity) (HCC) 12/12/2017  . Hypothyroidism following radioiodine therapy 07/15/2015    Past Surgical History:  Procedure Laterality Date  . NO PAST SURGERIES          Home Medications    Prior to Admission medications   Medication Sig Start Date End Date Taking? Authorizing Provider  levothyroxine (SYNTHROID) 75 MCG tablet TAKE 1 TABLET BY MOUTH DAILY BEFORE BREAKFAST. 10/15/18   Roma KayserNida, Gebreselassie W, MD  naproxen (NAPROSYN) 500 MG tablet Take 1 tablet (500 mg total) by mouth 2 (two) times daily with a  meal. 05/21/18   Rancour, Jeannett SeniorStephen, MD    Family History Family History  Problem Relation Age of Onset  . Asthma Mother   . Lupus Sister     Social History Social History   Tobacco Use  . Smoking status: Current Every Day Smoker    Packs/day: 0.50    Years: 10.00    Pack years: 5.00    Types: Cigarettes  . Smokeless tobacco: Never Used  Substance Use Topics  . Alcohol use: Yes    Comment: Socially (every couple months)  . Drug use: No     Allergies   Patient has no known allergies.   Review of Systems Review of Systems  Constitutional: Negative for appetite change and fatigue.  HENT: Negative for congestion, ear discharge and sinus pressure.   Eyes: Negative for discharge.  Respiratory: Negative for cough.   Cardiovascular: Positive for chest pain.  Gastrointestinal: Negative for abdominal pain and diarrhea.  Genitourinary: Negative for frequency and hematuria.  Musculoskeletal: Negative for back pain.  Skin: Negative for rash.  Neurological: Negative for seizures and headaches.  Psychiatric/Behavioral: Negative for hallucinations.     Physical Exam Updated Vital Signs BP 112/77 (BP Location: Left Arm)   Pulse 76   Temp 98.3 F (36.8 C) (Oral)   Resp 16   Physical Exam Vitals signs and nursing note reviewed.  Constitutional:      Appearance: He  is well-developed.  HENT:     Head: Normocephalic.     Nose: Nose normal.  Eyes:     General: No scleral icterus.    Conjunctiva/sclera: Conjunctivae normal.  Neck:     Musculoskeletal: Neck supple.     Thyroid: No thyromegaly.  Cardiovascular:     Rate and Rhythm: Normal rate and regular rhythm.     Heart sounds: No murmur. No friction rub. No gallop.   Pulmonary:     Breath sounds: No stridor. No wheezing or rales.  Chest:     Chest wall: No tenderness.  Abdominal:     General: There is no distension.     Tenderness: There is no abdominal tenderness. There is no rebound.     Comments: Mild  tenderness over xiphoid  Musculoskeletal: Normal range of motion.  Lymphadenopathy:     Cervical: No cervical adenopathy.  Skin:    Findings: No erythema or rash.  Neurological:     Mental Status: He is oriented to person, place, and time.     Motor: No abnormal muscle tone.     Coordination: Coordination normal.  Psychiatric:        Behavior: Behavior normal.      ED Treatments / Results  Labs (all labs ordered are listed, but only abnormal results are displayed) Labs Reviewed - No data to display  EKG EKG Interpretation  Date/Time:  Thursday December 18 2018 17:43:01 EDT Ventricular Rate:  73 PR Interval:    QRS Duration: 89 QT Interval:  391 QTC Calculation: 431 R Axis:   -18 Text Interpretation:  Sinus rhythm Borderline left axis deviation Borderline T abnormalities, anterior leads Confirmed by Milton Ferguson [phone]) on 12/18/2018 5:58:25 PM   Radiology Dg Chest 2 View  Result Date: 12/18/2018 CLINICAL DATA:  Midsternal chest pain starting last week. EXAM: CHEST - 2 VIEW COMPARISON:  Chest x-ray dated 12/05/2011. FINDINGS: The heart size and mediastinal contours are within normal limits. Both lungs are clear. The visualized skeletal structures are unremarkable. IMPRESSION: No active cardiopulmonary disease. No evidence of pneumonia or pulmonary edema. Electronically Signed   By: Franki Cabot M.D.   On: 12/18/2018 18:14    Procedures Procedures (including critical care time)  Medications Ordered in ED Medications - No data to display   Initial Impression / Assessment and Plan / ED Course  I have reviewed the triage vital signs and the nursing notes.  Pertinent labs & imaging results that were available during my care of the patient were reviewed by me and considered in my medical decision making (see chart for details).        Patient had a CBC thyroid studies chemistries all done a couple days ago.  I reviewed those results and they are normal.  EKG done today  shows some nonspecific changes which is not any different than last year.  Chest x-ray negative.  Patient with most likely muscle skeletal chest discomfort.  He is referred to cardiology to see whether further evaluation of the nonspecific changes in his EKG is necessary. Final Clinical Impressions(s) / ED Diagnoses   Final diagnoses:  Atypical chest pain    ED Discharge Orders    None       Milton Ferguson, MD 12/18/18 1844

## 2018-12-18 NOTE — Discharge Instructions (Signed)
Take Tylenol or Motrin for pain.  Follow-up with cardiology for evaluation of your EKG changes.  They may consider doing a stress test

## 2018-12-18 NOTE — ED Provider Notes (Signed)
[facility]   [id number] 12/18/18 Arrival Time: 5284   CC: CHEST discomfort  SUBJECTIVE:  Donald Rollins is a 38 y.o. male hx significant for obesity, thyroid disease, tobacco use, and H/O possible TIA, who presents with complaint of intermittent chest discomfort x 1 week.  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  Denies a specific injury, trauma, or recent strenuous upper body activity.  Localizes chest pain to the substernal region.  Describes as stable, that is intermittent and sharp, worse with being hunched forward.  Improved with sitting up straight.  Rates pain as 4-5/10.   Has NOT tried OTC medications.  Denies radiating symptoms.  Reports previous hx of symptoms related to costochondritis.  Complains of intermittent palpitations, and intermittent calf pain. Denies fever, chills, lightheadedness, dizziness, tachycardia, SOB, nausea, vomiting, abdominal pain, changes in bowel or bladder habits, diaphoresis, numbness/tingling in extremities, peripheral edema, or anxiety.    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    Denies SOB, calf swelling, recent surgery, malignancy, hormone use, or previous blood clot  Denies close relatives with cardiac hx  Previous cardiac testing: electrocardiogram (ECG).  ROS: As per HPI. Past Medical History:  Diagnosis Date  . Obesity, Class III, BMI 40-49.9 (morbid obesity) ([place]) 12/12/2017  . Thyroid disease   . Tobacco abuse    Past Surgical History:  Procedure Laterality Date  . NO PAST SURGERIES     No Known Allergies No current facility-administered medications on file prior to encounter.    Current Outpatient Medications on File Prior to Encounter  Medication Sig Dispense Refill  . levothyroxine (SYNTHROID) 75 MCG tablet TAKE 1 TABLET BY MOUTH  DAILY BEFORE BREAKFAST. 30 tablet 3  . naproxen (NAPROSYN) 500 MG tablet Take 1 tablet (500 mg total) by mouth 2 (two) times daily with a meal. 30 tablet 0   Social History   Socioeconomic History  . Marital status: Single    Spouse name: Not on file  . Number of children: Not on file  . Years of education: Not on file  . Highest education level: Not on file  Occupational History  . Occupation: Truck [employer] operational officer  . Financial resource strain: Not on file  . Food insecurity    Worry: Not on file    Inability: Not on file  . Transportation needs    Medical: Not on file    Non-medical: Not on file  Tobacco Use  . Smoking status: Current Every Day Smoker    Packs/day: 0.50    Years: 10.00    Pack years: 5.00    Types: Cigarettes  . Smokeless tobacco: Never Used  Substance and Sexual Activity  . Alcohol use: Yes    Comment: Socially (every couple months)  . Drug use: No  . Sexual activity: Yes  Lifestyle  . Physical activity    Days per week: Not on file    Minutes per session: Not on file  . Stress: Not on file  Relationships  . Social Herbalist on phone: Not on file    Gets together: Not on file    Attends religious service: Not on file    Active member of club or organization: Not on file    Attends meetings of clubs or organizations: Not  on file    Relationship status: Not on file  . Intimate partner violence    Fear of current or ex partner: Not on file    Emotionally abused: Not on file    Physically abused: Not on file    Forced sexual activity: Not on file  Other Topics Concern  . Not on file  Social History Narrative   Works as a Naval architecttruck driver, single. Independent of ADLs and mobility.   Family History  Problem Relation Age of Onset  . Asthma Mother   . Lupus Sister      OBJECTIVE:  Vitals:   12/18/18 1608  BP: 127/74  Pulse: 85  Resp: 20  Temp: 98.1 F (36.7 C)  SpO2: 98%    General appearance: alert; no distress Eyes:  PERRLA; EOMI; conjunctiva normal HENT: normocephalic; atraumatic Neck: supple; no carotid bruits Lungs: clear to auscultation bilaterally without adventitious breath sounds Heart: regular rate and rhythm.  Clear S1 and S2 without rubs, gallops, or murmur. Radial pulses 2+ bilaterally Chest Wall: no heaves, lifts or thrills; TTP over left medial chest; NTTP with AP or lateral chest compression Abdomen: soft, protuberant, non-tender; bowel sounds normal; no guarding Extremities: no cyanosis or edema; symmetrical with no gross deformities; proximal calf circumference 46 cm, distal calf circumference 27 cm, equal and bilateral; negative homan's Skin: warm and dry Psychological: alert and cooperative; normal mood and affect  ECG: Orders placed or performed during the hospital encounter of 05/21/18  . EKG 12-Lead  . EKG 12-Lead  . EKG 12-Lead  . EKG 12-Lead  . EKG   EKG normal sinus rhythm without ST elevations, or prolonged PR interval.  Inverted T-waves in V4-5 leads, changed from previous EKG done in December of 2019.  No narrowing or widening of the QRS complexes  ASSESSMENT & PLAN:  1. Chest pain, unspecified type   2. Nonspecific abnormal electrocardiogram (ECG) (EKG)    Symptoms and PE consistent with MSK pain, however, abnormal EKG with changes from previous EKG cannot rule out cardiac etiology.    Recommending further evaluation and management in the ED for chest pain with abnormal EKG.  EKG changes from previous EKG on file.  Patient aware and in agreement with this plan.  Denies EMS transport.  [facility]    Alvino ChapelWurst, GideonBrittany, [place] JerseyPA-C 12/18/18 1719

## 2018-12-18 NOTE — ED Triage Notes (Signed)
Patient reports mid sternal chest pain that started last week. No other symptoms, patient reports occas radiation to his R arm. Seen at Urgent Care and sent for eval.

## 2018-12-18 NOTE — ED Triage Notes (Signed)
Pt states he has had chest pain x 2 weeks. States it feels like knot in chest wall that is painful to palpate

## 2018-12-18 NOTE — Discharge Instructions (Addendum)
Recommending further evaluation and management in the ED for chest pain with abnormal EKG.  EKG changes from previous EKG on file.  Patient aware and in agreement with this plan.  Denies EMS transport.  Will go by private vehicle to [facility] ED

## 2019-01-20 ENCOUNTER — Other Ambulatory Visit: Payer: Self-pay

## 2019-01-20 ENCOUNTER — Encounter: Payer: Self-pay | Admitting: Neurology

## 2019-01-20 ENCOUNTER — Ambulatory Visit: Payer: BC Managed Care – PPO | Admitting: Neurology

## 2019-01-20 VITALS — BP 127/58 | HR 90 | Temp 96.0°F | Ht 71.0 in | Wt 324.0 lb

## 2019-01-20 DIAGNOSIS — Q0700 Arnold-Chiari syndrome without spina bifida or hydrocephalus: Secondary | ICD-10-CM

## 2019-01-20 DIAGNOSIS — R451 Restlessness and agitation: Secondary | ICD-10-CM | POA: Diagnosis not present

## 2019-01-20 DIAGNOSIS — R202 Paresthesia of skin: Secondary | ICD-10-CM

## 2019-01-20 MED ORDER — TOPIRAMATE 50 MG PO TABS: 50.0000 mg | ORAL_TABLET | Freq: Two times a day (BID) | ORAL | 2 refills | Status: DC

## 2019-01-20 NOTE — Progress Notes (Signed)
[facility] [address] [place]. [place] [address] [phone]       OFFICE CONSULT NOTE  Mr. JESIEL GARATE Date of Birth:  June 23, 1980 Medical Record Number:  [id number]   Referring MD: Wende Neighbors  Reason for Referral: Numbness  HPI: Mr. Corado is a 38 year old African-American male seen today for initial office consultation visit for paresthesias.  History is obtained from the patient and review of referral notes and electronic medical records.  I have also reviewed imaging films in PACS.  He states that a month ago he developed pain, tingling and numbness in his forearms which quickly spread to his shoulders and subsequently to his legs as well.  This is intermittent and is mostly related to being inactive like sitting or lying down.  If he gets up and walks around the symptoms quickly dissipate.  He is also noticed this at nighttime as well.  Occasionally this also involves his right face.  The patient actually had an episode of right face paresthesias for which she was evaluated by neurologist Dr. Tomi Likens in 2019.  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  He presented to the ED at [facility] on 12/12/17 and was admitted for stroke workup.  MRI of brain showed few punctate T2/FLAIR foci in the left frontal white matter, but no acute findings.  Sinuses did not demonstrate noticeable sinusitis.  MRA of head showed no large vessel stenosis or occlusion.  TTE showed EF 55-60%.  Carotid doppler showed no hemodynamically significant stenosis.  Hgb A1c was 5.6.  LDL was 100.  Urine drug screen was negative.  HIV was negative.  TSH was less than 0.010 but free T4 was 1.05.  He was advised to start ASA 81mg  daily and discharged Interestingly on inquiry the patient states that he has had multiple episodes of  blackouts and brief loss of consciousness when he laughs a lot.  He is now been careful and has stopped himself from getting excited and laughing to avoid losing consciousness.  He denies history of sleep apnea, snoring or having sleep disturbance.  He denies episodes of sleep paralysis.  The patient has not tried any specification medications for his paresthesias but feels he is not the kind of person who would like to try medications.  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   Food insecurity    Worry: Not on file    Inability: Not on file   Transportation needs    Medical: Not on file    Non-medical: Not on file  Tobacco Use   Smoking status: Current Every Day Smoker    Packs/day: 0.50  Years: 10.00    Pack years: 5.00    Types: Cigarettes   Smokeless tobacco: Never Used  Substance and Sexual Activity   Alcohol use: Yes    Comment: Socially (every couple months)   Drug use: No   Sexual activity: Yes  Lifestyle   Physical activity    Days per week: Not on file    Minutes per session: Not on file   Stress: Not on file  Relationships   Social connections    Talks on phone: Not on file    Gets together: Not on file    Attends religious service: Not on file    Active member of club or organization: Not on file    Attends meetings of clubs or organizations:  Not on file    Relationship status: Not on file   Intimate partner violence    Fear of current or ex partner: Not on file    Emotionally abused: Not on file    Physically abused: Not on file    Forced sexual activity: Not on file  Other Topics Concern   Not on file  Social History Narrative   Works as a Naval architecttruck driver, single. Independent of ADLs and mobility.    Medications:   Current Outpatient Medications on File Prior to Visit  Medication Sig Dispense Refill   levothyroxine (SYNTHROID) 75 MCG tablet TAKE 1 TABLET BY MOUTH DAILY BEFORE BREAKFAST. (Patient taking differently: 50 mcg. ) 30 tablet 3   No current facility-administered medications on file prior to visit.     Allergies:  No Known Allergies  Physical Exam General: Obese young African-American male, seated, in no evident distress Head: head normocephalic and atraumatic.   Neck: supple with no carotid or supraclavicular bruits Cardiovascular: regular rate and rhythm, no murmurs Musculoskeletal: no deformity Skin:  no rash/petichiae Vascular:  Normal pulses all extremities  Neurologic Exam Mental Status: Awake and fully alert. Oriented to place and time. Recent and remote memory intact. Attention span, concentration and fund of knowledge appropriate. Mood and affect appropriate.  Cranial Nerves: Fundoscopic exam reveals sharp disc margins. Pupils equal, briskly reactive to light. Extraocular movements full without nystagmus. Visual fields full to confrontation. Hearing intact. Facial sensation intact. Face, tongue, palate moves normally and symmetrically.  Motor: Normal bulk and tone. Normal strength in all tested extremity muscles. Sensory.: intact to touch , pinprick , position and vibratory sensation.  Coordination: Rapid alternating movements normal in all extremities. Finger-to-nose and heel-to-shin performed accurately bilaterally. Gait and Station: Arises from chair without difficulty. Stance is normal. Gait  demonstrates normal stride length and balance . Able to heel, toe and tandem walk without difficulty.  Reflexes: 1+ and symmetric. Toes downgoing.      ASSESSMENT: Mr. Toni ArthursFuller is a 38 year old African-American gentleman with intermittent dysesthesias involving all 4 extremities of unclear etiology.  Also interesting complaints of brief loss of consciousness with laughing and coughing with an abnormal MRI scan suggestive of Arnold-Chiari malformation which may be contributing.    PLAN: I had a long discussion with the patient with his symptoms of dysesthesias in his extremities related to inactivity possibly suggesting restless leg syndrome but he also has episodes of passing out with coughing and an abnormal MRI scan suggesting Arnold-Chiari which may be contributing.  I recommend further evaluation by checking MRI scan of the brain with and without contrast and EMG nerve conduction study.  Trial of Topamax 50 mg daily for 1 week to be increased to twice daily if  tolerated without side effects to help with his paresthesias.  Check CBC with serum iron levels and ferritin levels.  I advised him to continue aspirin daily and to stop smoking cigarettes and cut back alcohol intake.  Greater than 50% time during this 45-minute visit was spent on counseling and coordination of care about his paresthesias and episodes of brief loss of consciousness and answering questions.  He will return for follow-up in the future in 2 months or call earlier if necessary. Delia HeadyPramod Donnis Phaneuf, MD  [facility] [address] [place], [place] [address]  Phone [phone] Fax [phone] Note: This document was prepared with digital dictation and possible smart phrase technology. Any transcriptional errors that result from this process are unintentional.

## 2019-01-20 NOTE — Patient Instructions (Signed)
I had a long discussion with the patient with his symptoms of dysesthesias in his extremities related to inactivity possibly suggesting restless leg syndrome but he also has episodes of passing out with coughing and an abnormal MRI scan suggesting Arnold-Chiari which may be contributing.  I recommend further evaluation by checking MRI scan of the brain with and without contrast and EMG nerve conduction study.  Trial of Topamax 50 mg daily for 1 week to be increased to twice daily if tolerated without side effects to help with his paresthesias.  Check CBC with serum iron levels and ferritin levels.  He will return for follow-up in the future in 2 months or call earlier if necessary.  Restless Legs Syndrome Restless legs syndrome is a condition that causes uncomfortable feelings or sensations in the legs, especially while sitting or lying down. The sensations usually cause an overwhelming urge to move the legs. The arms can also sometimes be affected. The condition can range from mild to severe. The symptoms often interfere with a person's ability to sleep. What are the causes? The cause of this condition is not known. What increases the risk? The following factors may make you more likely to develop this condition:  Being older than 50.  Pregnancy.  Being a woman. In general, the condition is more common in women than in men.  A family history of the condition.  Having iron deficiency.  Overuse of caffeine, nicotine, or alcohol.  Certain medical conditions, such as kidney disease, Parkinson's disease, or nerve damage.  Certain medicines, such as those for high blood pressure, nausea, colds, allergies, depression, and some heart conditions. What are the signs or symptoms? The main symptom of this condition is uncomfortable sensations in the legs, such as:  Pulling.  Tingling.  Prickling.  Throbbing.  Crawling.  Burning. Usually, the sensations:  Affect both sides of the body.   Are worse when you sit or lie down.  Are worse at night. These may wake you up or make it difficult to fall asleep.  Make you have a strong urge to move your legs.  Are temporarily relieved by moving your legs. The arms can also be affected, but this is rare. People who have this condition often have tiredness during the day because of their lack of sleep at night. How is this diagnosed? This condition may be diagnosed based on:  Your symptoms.  Blood tests. In some cases, you may be monitored in a sleep lab by a specialist (a sleep study). This can detect any disruptions in your sleep. How is this treated? This condition is treated by managing the symptoms. This may include:  Lifestyle changes, such as exercising, using relaxation techniques, and avoiding caffeine, alcohol, or tobacco.  Medicines. Anti-seizure medicines may be tried first. Follow these instructions at home:     General instructions  Take over-the-counter and prescription medicines only as told by your health care provider.  Use methods to help relieve the uncomfortable sensations, such as: ? Massaging your legs. ? Walking or stretching. ? Taking a cold or hot bath.  Keep all follow-up visits as told by your health care provider. This is important. Lifestyle  Practice good sleep habits. For example, go to bed and get up at the same time every day. Most adults should get 7-9 hours of sleep each night.  Exercise regularly. Try to get at least 30 minutes of exercise most days of the week.  Practice ways of relaxing, such as yoga or meditation.  Avoid caffeine and alcohol.  Do not use any products that contain nicotine or tobacco, such as cigarettes and e-cigarettes. If you need help quitting, ask your health care provider. Contact a health care provider if:  Your symptoms get worse or they do not improve with treatment. Summary  Restless legs syndrome is a condition that causes uncomfortable  feelings or sensations in the legs, especially while sitting or lying down.  The symptoms often interfere with a person's ability to sleep.  This condition is treated by managing the symptoms. You may need to make lifestyle changes or take medicines. This information is not intended to replace advice given to you by your health care provider. Make sure you discuss any questions you have with your health care provider. Document Released: 05/18/2002 Document Revised: 06/17/2017 Document Reviewed: 06/17/2017 Elsevier Patient Education  2020 [employer].

## 2019-01-21 ENCOUNTER — Telehealth: Payer: Self-pay | Admitting: Neurology

## 2019-01-21 NOTE — Telephone Encounter (Signed)
[facility] pt has [facility] they will reach out to the patient to schedule. I did fax the order.

## 2019-01-24 LAB — CMP14+FE+CBC/D/PLT+TIBC+FER...
ALT: 28 IU/L (ref 0–44)
AST: 19 IU/L (ref 0–40)
Albumin/Globulin Ratio: 2 (ref 1.2–2.2)
Albumin: 4.6 g/dL (ref 4.0–5.0)
Alkaline Phosphatase: 62 IU/L (ref 39–117)
BUN/Creatinine Ratio: 12 (ref 9–20)
BUN: 13 mg/dL (ref 6–20)
Basophils Absolute: 0.1 10*3/uL (ref 0.0–0.2)
Basos: 1 %
Bilirubin Total: 0.2 mg/dL (ref 0.0–1.2)
CO2: 23 mmol/L (ref 20–29)
Calcium: 10 mg/dL (ref 8.7–10.2)
Chloride: 103 mmol/L (ref 96–106)
Coenzyme Q10, Total: 1.27 ug/mL (ref 0.37–2.20)
Creatinine, Ser: 1.05 mg/dL (ref 0.76–1.27)
EOS (ABSOLUTE): 0.2 10*3/uL (ref 0.0–0.4)
Eos: 3 %
Ferritin: 113 ng/mL (ref 30–400)
Folate: 5.3 ng/mL (ref >3.0)
GFR calc Af Amer: 104 mL/min/{1.73_m2} (ref >59)
GFR calc non Af Amer: 90 mL/min/{1.73_m2} (ref >59)
Globulin, Total: 2.3 g/dL (ref 1.5–4.5)
Glucose: 93 mg/dL (ref 65–99)
Hematocrit: 44 % (ref 37.5–51.0)
Hemoglobin: 15 g/dL (ref 13.0–17.7)
Homocysteine: 12.6 umol/L (ref 0.0–14.5)
Immature Grans (Abs): 0 10*3/uL (ref 0.0–0.1)
Immature Granulocytes: 0 %
Iron Saturation: 24 % (ref 15–55)
Iron: 87 ug/dL (ref 38–169)
Lymphocytes Absolute: 2.8 10*3/uL (ref 0.7–3.1)
Lymphs: 39 %
MCH: 27.5 pg (ref 26.6–33.0)
MCHC: 34.1 g/dL (ref 31.5–35.7)
MCV: 81 fL (ref 79–97)
Monocytes Absolute: 0.6 10*3/uL (ref 0.1–0.9)
Monocytes: 8 %
Neutrophils Absolute: 3.6 10*3/uL (ref 1.4–7.0)
Neutrophils: 49 %
Platelets: 237 10*3/uL (ref 150–450)
Potassium: 4.2 mmol/L (ref 3.5–5.2)
RBC: 5.45 x10E6/uL (ref 4.14–5.80)
RDW: 12.5 % (ref 11.6–15.4)
Sodium: 141 mmol/L (ref 134–144)
Testosterone: 172 ng/dL — ABNORMAL LOW (ref 264–916)
Total Iron Binding Capacity: 359 ug/dL (ref 250–450)
Total Protein: 6.9 g/dL (ref 6.0–8.5)
Transferrin: 288 mg/dL (ref 177–329)
UIBC: 272 ug/dL (ref 111–343)
Vit D, 25-Hydroxy: 18.1 ng/mL — ABNORMAL LOW (ref 30.0–100.0)
Vitamin B-12: 519 pg/mL (ref 232–1245)
WBC: 7.2 10*3/uL (ref 3.4–10.8)
Zinc: 77 ug/dL (ref 56–134)

## 2019-01-26 ENCOUNTER — Telehealth: Payer: Self-pay

## 2019-01-26 NOTE — Telephone Encounter (Signed)
-----   Message from Garvin Fila, MD sent at 01/23/2019  1:52 PM EDT ----- Donald Rollins inform the patient that comprehensive metabolic panel, CBC, vitamin B12 and folate levels were normal.  Vitamin D and testosterone levels were low and he needs to see his primary physician Dr. Nevada Crane for treatment for that.

## 2019-01-26 NOTE — Telephone Encounter (Signed)
Labs fax twice and confirmed to PCP.

## 2019-01-26 NOTE — Telephone Encounter (Signed)
I called patient about his lab work. I stated the CBC, CMP b12 and folate levels were normal. I stated his vitamin d and testosterone levels were low and needs to be address and reviewed by his PCP for treatment. I stated a copy will be sent to his PCP and to call the office this week for recommendations.PT verbalized understanding.

## 2019-01-26 NOTE — Telephone Encounter (Signed)
pt is scheduled at [facility] for 01/30/19.

## 2019-02-04 ENCOUNTER — Telehealth: Payer: Self-pay

## 2019-02-04 NOTE — Telephone Encounter (Signed)
Results paper given to Dr. Leonie Man for review.

## 2019-02-05 ENCOUNTER — Telehealth: Payer: Self-pay | Admitting: *Deleted

## 2019-02-05 NOTE — Telephone Encounter (Signed)
Pt called want mri results. Please call [phone]

## 2019-02-05 NOTE — Telephone Encounter (Signed)
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  I will try to send for the actual films on a disc for me to look at and compared with his previous MRI from 2015.  His lab work also showed low vitamin D and testosterone levels.  He has already seen his primary care physician and started vitamin D replacement and will have repeat lab work for testosterone before he starts replacement.

## 2019-02-08 ENCOUNTER — Other Ambulatory Visit: Payer: Self-pay | Admitting: Neurology

## 2019-02-18 ENCOUNTER — Telehealth: Payer: Self-pay | Admitting: *Deleted

## 2019-02-18 NOTE — Telephone Encounter (Signed)
Pt CD and report from  [place] desk.

## 2019-03-30 ENCOUNTER — Ambulatory Visit: Payer: BC Managed Care – PPO | Admitting: Neurology

## 2019-03-30 ENCOUNTER — Other Ambulatory Visit: Payer: Self-pay

## 2019-03-30 ENCOUNTER — Encounter: Payer: Self-pay | Admitting: Neurology

## 2019-03-30 VITALS — BP 129/83 | HR 92 | Temp 97.9°F | Wt 328.0 lb

## 2019-03-30 DIAGNOSIS — R202 Paresthesia of skin: Secondary | ICD-10-CM | POA: Diagnosis not present

## 2019-03-30 NOTE — Progress Notes (Signed)
[facility] [address] [place]. [place] [address] [phone]       OFFICE CONSULT NOTE  Mr. Donald Rollins Date of Birth:  1981/03/24 Medical Record Number:  [id number]   Referring MD: Wende Neighbors  Reason for Referral: Numbness  HPI: Initial consultation visit 01/20/2019 Mr. Leamer is a 38 year old African-American male seen today for initial office consultation visit for paresthesias.  History is obtained from the patient and review of referral notes and electronic medical records.  I have also reviewed imaging films in PACS.  He states that a month ago he developed pain, tingling and numbness in his forearms which quickly spread to his shoulders and subsequently to his legs as well.  This is intermittent and is mostly related to being inactive like sitting or lying down.  If he gets up and walks around the symptoms quickly dissipate.  He is also noticed this at nighttime as well.  Occasionally this also involves his right face.  The patient actually had an episode of right face paresthesias for which she was evaluated by neurologist Dr. Tomi Likens in 2019.  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  He presented to the ED at [facility] on 12/12/17 and was admitted for stroke workup.  MRI of brain showed few punctate T2/FLAIR foci in the left frontal white matter, but no acute findings.  Sinuses did not demonstrate noticeable sinusitis.  MRA of head showed no large vessel stenosis or occlusion.  TTE showed EF 55-60%.  Carotid doppler showed no hemodynamically significant stenosis.  Hgb A1c was 5.6.  LDL was 100.  Urine drug screen was negative.  HIV was negative.  TSH was less than 0.010 but free T4 was 1.05.  He was advised to start ASA 81mg  daily and discharged Interestingly on inquiry the patient states  that he has had multiple episodes of blackouts and brief loss of consciousness when he laughs a lot.  He is now been careful and has stopped himself from getting excited and laughing to avoid losing consciousness.  He denies history of sleep apnea, snoring or having sleep disturbance.  He denies episodes of sleep paralysis.  The patient has not tried any specification medications for his paresthesias but feels he is not the kind of person who would like to try medications. Update 03/30/2019 : He returns for follow-up after last visit 2 months ago.  He states he is doing a lot better and his tingling and numbness have resolved since starting vitamin D replacement.  He underwent lab work on 01/20/2019 which showed complete metabolic panel and CBC were normal.  Iron studies were normal.  Vitamin D levels were low at 18.1 and testosterone levels were low at 172.  Vitamin B12 and folate were normal.  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  There are also few scattered T2/flair hyperintensities which are nonspecific.  Incidental adenoid hypertrophy and diffuse T1 hypointense and heterogeneous marrow signal which is nonspecific.  No mention of Arnold-Chiari is made on the report.  Previous MRI scan of the brain which I have reviewed on 12/13/2017 was done  without contrast and does not show this abnormality.  Has an EMG nerve conduction appointment for tomorrow.  He plans to discuss with his primary physician his low testosterone levels and treatment for that.  He has no new complaints today.  The patient was started by me on Topamax which he took daily for a few days and then discontinued as he felt it was not helping and did not like the way it made him feel  ROS:   14 system  review of systems is positive for numbness, tingling, only and all other systems negative  PMH:  Past Medical History:  Diagnosis Date   Childhood asthma    Hypothyroidism    Obesity, Class III, BMI 40-49.9 (morbid obesity) ([facility]) 12/12/2017   Thyroid disease    Tobacco abuse     Social History:  Social History   Socioeconomic History   Marital status: Single    Spouse name: Not on file   Number of children: Not on file   Years of education: Not on file   Highest education level: Not on file  Occupational History   Occupation: Truck Conservator, 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   Food insecurity    Worry: Not on file    Inability: Not on file   Transportation needs    Medical: Not on file    Non-medical: Not on file  Tobacco Use   Smoking status: Current Every Day Smoker    Packs/day: 0.50    Years: 10.00    Pack years: 5.00    Types: Cigarettes   Smokeless tobacco: Never Used  Substance and Sexual Activity   Alcohol use: Yes    Comment: Socially (every couple months)   Drug use: No   Sexual activity: Yes  Lifestyle   Physical activity    Days per week: Not on file    Minutes per session: Not on file   Stress: Not on file  Relationships   Social connections    Talks on phone: Not on file    Gets together: Not on file    Attends religious service: Not on file    Active member of club or organization: Not on file    Attends meetings of clubs or organizations: Not on file    Relationship status: Not on file   Intimate partner violence    Fear of current or ex partner: Not on file    Emotionally abused: Not on file    Physically abused: Not on file    Forced sexual activity: Not on file  Other Topics Concern   Not on file  Social History Narrative   Works as a Naval architect, single. Independent of ADLs and mobility.    Medications:   Current Outpatient Medications on File Prior to Visit  Medication Sig Dispense Refill    levothyroxine (SYNTHROID) 50 MCG tablet Take 50 mcg by mouth daily.     topiramate (TOPAMAX) 50 MG tablet TAKE 1 TABLET BY MOUTH ONCE DAILY FOR 7 DAYS, THEN 1 TABLET TWICE A DAY IF TOLERATED 60 tablet 2   Vitamin D, Ergocalciferol, (DRISDOL) 1.25 MG (50000 UT) CAPS capsule TAKE ONE TABLET ONCE PER WEEK FOR 8 WEEKS FOR VITAMIN D DEFICIENCY     No current facility-administered medications on file prior to visit.     Allergies:  No Known Allergies  Physical Exam General: Obese young African-American male, seated, in no evident distress Head: head normocephalic and atraumatic.  Neck: supple with no carotid or supraclavicular bruits Cardiovascular: regular rate and rhythm, no murmurs Musculoskeletal: no deformity Skin:  no rash/petichiae Vascular:  Normal pulses all extremities  Neurologic Exam Mental Status: Awake and fully alert. Oriented to place and time. Recent and remote memory intact. Attention span, concentration and fund of knowledge appropriate. Mood and affect appropriate.  Cranial Nerves: Fundoscopic exam reveals sharp disc margins. Pupils equal, briskly reactive to light. Extraocular movements full without nystagmus. Visual fields full to confrontation. Hearing intact. Facial sensation intact. Face, tongue, palate moves normally and symmetrically.  Motor: Normal bulk and tone. Normal strength in all tested extremity muscles. Sensory.: intact to touch , pinprick , position and vibratory sensation.  Coordination: Rapid alternating movements normal in all extremities. Finger-to-nose and heel-to-shin performed accurately bilaterally. Gait and Station: Arises from chair without difficulty. Stance is normal. Gait demonstrates normal stride length and balance . Able to heel, toe and tandem walk without difficulty.  Reflexes: 1+ and symmetric. Toes downgoing.      ASSESSMENT: Mr. Toni ArthursFuller is a 38 year old African-American gentleman with intermittent dysesthesias involving all 4  extremities of unclear etiology but appears to have resolved since starting vitamin D replacement.  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  I recommend he continue follow-up with his primary care physician and also discussed replacement of his low testosterone.  Keep appointment for EMG nerve conduction study tomorrow.  No further neurological testing is indicated at the present time.  He may return for follow-up in the future only as needed and no schedule appointment was made.intake.  Greater than 50% time during this 25-minute visit was spent on counseling and coordination of care about his paresthesias and answering questions.  He will return for follow-up in the future in 2 months or call earlier if necessary. Delia HeadyPramod Teryn Gust, MD  [facility] [address] [place], [place] [address]  Phone [phone] Fax [phone] Note: This document was prepared with digital dictation and possible smart phrase technology. Any transcriptional errors that result from this process are unintentional.

## 2019-03-30 NOTE — Patient Instructions (Signed)
I had a long discussion with the patient regarding his paresthesias which appear to have improved after starting vitamin D replacement.  I recommend he continue follow-up with his primary care physician and also discussed replacement of his low testosterone.  Keep appointment for EMG nerve conduction study tomorrow.  No further neurological testing is indicated at the present time.  He may return for follow-up in the future only as needed and no schedule appointment was made.

## 2019-03-31 ENCOUNTER — Encounter: Payer: BC Managed Care – PPO | Admitting: Neurology

## 2020-02-23 ENCOUNTER — Ambulatory Visit
Admission: EM | Admit: 2020-02-23 | Discharge: 2020-02-23 | Disposition: A | Discharge status: Home / Self Care | Payer: BC Managed Care – PPO | Attending: Internal Medicine | Admitting: Internal Medicine

## 2020-02-23 DIAGNOSIS — M549 Dorsalgia, unspecified: Secondary | ICD-10-CM

## 2020-02-23 MED ORDER — IBUPROFEN 800 MG PO TABS: 800.0000 mg | ORAL_TABLET | Freq: Three times a day (TID) | ORAL | 0 refills | Status: AC

## 2020-02-23 MED ORDER — METHOCARBAMOL 500 MG PO TABS: 500.0000 mg | ORAL_TABLET | Freq: Two times a day (BID) | ORAL | 0 refills | Status: AC

## 2020-02-23 MED ORDER — KETOROLAC TROMETHAMINE 60 MG/2ML IM SOLN
60.0000 mg | Freq: Once | INTRAMUSCULAR | Status: AC
  Administered 2020-02-23: 60 mg via INTRAMUSCULAR
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
## 2020-05-17 ENCOUNTER — Ambulatory Visit (HOSPITAL_COMMUNITY)
Admission: RE | Admit: 2020-05-17 | Discharge: 2020-05-17 | Disposition: A | Discharge status: Home / Self Care | Payer: BC Managed Care – PPO | Source: Ambulatory Visit | Attending: Internal Medicine | Admitting: Internal Medicine

## 2020-05-17 ENCOUNTER — Other Ambulatory Visit: Payer: Self-pay

## 2020-05-17 ENCOUNTER — Other Ambulatory Visit (HOSPITAL_COMMUNITY): Payer: Self-pay | Admitting: Internal Medicine

## 2020-05-17 DIAGNOSIS — R0782 Intercostal pain: Secondary | ICD-10-CM | POA: Insufficient documentation

## 2021-04-30 ENCOUNTER — Ambulatory Visit
Admission: EM | Admit: 2021-04-30 | Discharge: 2021-04-30 | Disposition: A | Discharge status: Home / Self Care | Payer: BC Managed Care – PPO

## 2021-04-30 ENCOUNTER — Other Ambulatory Visit: Payer: Self-pay

## 2021-04-30 DIAGNOSIS — J3089 Other allergic rhinitis: Secondary | ICD-10-CM

## 2021-04-30 MED ORDER — CETIRIZINE HCL 10 MG PO TABS: 10.0000 mg | ORAL_TABLET | Freq: Every day | ORAL | 3 refills | Status: DC

## 2021-04-30 MED ORDER — FLUTICASONE PROPIONATE 50 MCG/ACT NA SUSP: 1.0000 | Freq: Two times a day (BID) | NASAL | 3 refills | Status: AC

## 2021-04-30 MED ORDER — DEXAMETHASONE SODIUM PHOSPHATE 10 MG/ML IJ SOLN
10.0000 mg | Freq: Once | INTRAMUSCULAR | Status: AC
  Administered 2021-04-30: 10 mg via INTRAMUSCULAR

## 2021-04-30 NOTE — ED Triage Notes (Signed)
Patient states he has had a sore throat. He states he had a strep throat a month ago. Then after that he had drainage in his throat and he is coughing up yellow mucus.   He finished his dose of amoxicillin and cough pearls for the strep throat and is still having symptoms.   Also had Covid 3 months ago.  Denies Fever  Would like to discuss options for treatment before testing for anything.

## 2021-04-30 NOTE — ED Provider Notes (Signed)
[facility]    CSN: [id number] Arrival date & time: 04/30/21  6073      History   Chief Complaint No chief complaint on file.   HPI Donald Rollins is a 40 y.o. male.   Presenting today with 3 months of off-and-on recurring symptoms.  He states he initially 3 months ago had issues with postnasal drip, runny nose, nasal congestion, productive cough and was diagnosed with COVID.  He continued to have the postnasal drip and cough following this and he states it turned into a strep throat infection that was diagnosed via teledoc and given amoxicillin and Tessalon Perles.  He continued to have symptoms ongoing and he states they are worsening now.  Denies any fever, chills, body aches, chest pain, shortness of breath, new sick contacts.  Has taken some Sudafed here and there which provides temporary relief of symptoms.  He is unaware of any diagnosis of seasonal allergies in the past.   Past Medical History:  Diagnosis Date   Childhood asthma    Hypothyroidism    Obesity, Class III, BMI 40-49.9 (morbid obesity) (HCC) 12/12/2017   Thyroid disease    Tobacco abuse     Patient Active Problem List   Diagnosis Date Noted   Personal history of noncompliance with medical treatment, presenting hazards to health 12/19/2017   Sensory disturbance 12/13/2017   Right facial numbness    TIA (transient ischemic attack) 12/12/2017   Dizziness 12/12/2017   Tobacco abuse 12/12/2017   Obesity, Class III, BMI 40-49.9 (morbid obesity) (HCC) 12/12/2017   Hypothyroidism following radioiodine therapy 07/15/2015    Past Surgical History:  Procedure Laterality Date   NO PAST SURGERIES         Home Medications    Prior to Admission medications   Medication Sig Start Date End Date Taking? Authorizing Provider  cetirizine (ZYRTEC ALLERGY) 10 MG tablet Take 1 tablet (10 mg total) by mouth daily. 04/30/21  Yes Particia Nearing, PA-C  fluticasone [facility]) 50 MCG/ACT nasal spray  Place 1 spray into both nostrils 2 (two) times daily. 04/30/21  Yes Particia Nearing, PA-C  ibuprofen (ADVIL) 800 MG tablet Take 1 tablet (800 mg total) by mouth 3 (three) times daily. 02/23/20   Merrilee Jansky, MD  levothyroxine (SYNTHROID) 50 MCG tablet Take 50 mcg by mouth daily. 03/10/19   [provider]  OZEMPIC, 0.25 OR 0.5 MG/DOSE, 2 MG/1.5ML SOPN Inject 1.5 mLs into the skin once. Once a week dose 04/19/21   [provider]  Vitamin D, Ergocalciferol, (DRISDOL) 1.25 MG (50000 UT) CAPS capsule TAKE ONE TABLET ONCE PER WEEK FOR 8 WEEKS FOR VITAMIN D DEFICIENCY 03/10/19   [provider]  topiramate (TOPAMAX) 50 MG tablet TAKE 1 TABLET BY MOUTH ONCE DAILY FOR 7 DAYS, THEN 1 TABLET TWICE A DAY IF TOLERATED 02/09/19 02/23/20  Micki Riley, MD    Family History Family History  Problem Relation Age of Onset   Asthma Mother    Lupus Sister     Social History Social History   Tobacco Use   Smoking status: Every Day    Packs/day: 0.50    Years: 10.00    Pack years: 5.00    Types: Cigarettes   Smokeless tobacco: Never  Vaping Use   Vaping Use: Never used  Substance Use Topics   Alcohol use: Yes    Comment: Socially (every couple months)   Drug use: No     Allergies   Patient  has no known allergies.   Review of Systems Review of Systems Per HPI  Physical Exam Triage Vital Signs ED Triage Vitals  Enc Vitals Group     BP 04/30/21 1009 115/84     Pulse Rate 04/30/21 1009 84     Resp 04/30/21 1009 18     Temp 04/30/21 1009 98.4 F (36.9 C)     Temp Source 04/30/21 1009 Oral     SpO2 04/30/21 1009 94 %     Weight --      Height --      Head Circumference --      Peak Flow --      Pain Score 04/30/21 1005 7     Pain Loc --      Pain Edu? --      Excl. in GC? --    No data found.  Updated Vital Signs BP 115/84 (BP Location: Right Arm)   Pulse 84   Temp 98.4 F (36.9 C) (Oral)   Resp 18   SpO2 94%   Visual Acuity Right Eye  Distance:   Left Eye Distance:   Bilateral Distance:    Right Eye Near:   Left Eye Near:    Bilateral Near:     Physical Exam Vitals and nursing note reviewed.  Constitutional:      Appearance: He is well-developed.  HENT:     Head: Atraumatic.     Right Ear: External ear normal.     Left Ear: External ear normal.     Nose: Rhinorrhea present.     Mouth/Throat:     Pharynx: Posterior oropharyngeal erythema present. No oropharyngeal exudate.     Comments: Posterior oropharyngeal erythema, no exudates or significant tonsillar edema.  Uvula midline, oral airway patent Eyes:     Conjunctiva/sclera: Conjunctivae normal.     Pupils: Pupils are equal, round, and reactive to light.  Cardiovascular:     Rate and Rhythm: Normal rate and regular rhythm.     Heart sounds: Normal heart sounds.  Pulmonary:     Effort: Pulmonary effort is normal. No respiratory distress.     Breath sounds: No wheezing or rales.  Musculoskeletal:        General: Normal range of motion.     Cervical back: Normal range of motion and neck supple.  Lymphadenopathy:     Cervical: No cervical adenopathy.  Skin:    General: Skin is warm and dry.  Neurological:     Mental Status: He is alert and oriented to person, place, and time.  Psychiatric:        Behavior: Behavior normal.     UC Treatments / Results  Labs (all labs ordered are listed, but only abnormal results are displayed) Labs Reviewed - No data to display  EKG   Radiology No results found.  Procedures Procedures (including critical care time)  Medications Ordered in UC Medications  dexamethasone (DECADRON) injection 10 mg (10 mg Intramuscular Given 04/30/21 1126)    Initial Impression / Assessment and Plan / UC Course  I have reviewed the triage vital signs and the nursing notes.  Pertinent labs & imaging results that were available during my care of the patient were reviewed by me and considered in my medical decision making (see  chart for details).     Strongly suspect uncontrolled seasonal allergies given persistence and duration of symptoms, quality of symptoms.  We will treat with IM Decadron, start good allergy regimen with Zyrtec and Flonase  twice daily.  Discussed supportive over-the-counter medications and home care.  Return for acutely worsening symptoms.  Final Clinical Impressions(s) / UC Diagnoses   Final diagnoses:  Seasonal allergic rhinitis due to other allergic trigger   Discharge Instructions   None    ED Prescriptions     Medication Sig Dispense Auth. Provider   cetirizine (ZYRTEC ALLERGY) 10 MG tablet Take 1 tablet (10 mg total) by mouth daily. 30 tablet Particia Nearing, PA-C   fluticasone [facility]) 50 MCG/ACT nasal spray Place 1 spray into both nostrils 2 (two) times daily. 16 g Particia Nearing, [place]      PDMP not reviewed this encounter.   Particia Nearing, [place] 04/30/21 1133

## 2021-07-28 ENCOUNTER — Other Ambulatory Visit: Payer: Self-pay

## 2021-07-28 ENCOUNTER — Ambulatory Visit
Admission: EM | Admit: 2021-07-28 | Discharge: 2021-07-28 | Disposition: A | Discharge status: Home / Self Care | Payer: BC Managed Care – PPO | Attending: Urgent Care | Admitting: Urgent Care

## 2021-07-28 ENCOUNTER — Encounter: Payer: Self-pay | Admitting: Emergency Medicine

## 2021-07-28 DIAGNOSIS — R062 Wheezing: Secondary | ICD-10-CM | POA: Diagnosis not present

## 2021-07-28 DIAGNOSIS — F17200 Nicotine dependence, unspecified, uncomplicated: Secondary | ICD-10-CM | POA: Diagnosis not present

## 2021-07-28 DIAGNOSIS — R0989 Other specified symptoms and signs involving the circulatory and respiratory systems: Secondary | ICD-10-CM

## 2021-07-28 DIAGNOSIS — J209 Acute bronchitis, unspecified: Secondary | ICD-10-CM

## 2021-07-28 DIAGNOSIS — R052 Subacute cough: Secondary | ICD-10-CM

## 2021-07-28 MED ORDER — PREDNISONE 50 MG PO TABS: 50.0000 mg | ORAL_TABLET | Freq: Every day | ORAL | 0 refills | Status: AC

## 2021-07-28 MED ORDER — PROMETHAZINE-DM 6.25-15 MG/5ML PO SYRP: 5.0000 mL | ORAL_SOLUTION | Freq: Four times a day (QID) | ORAL | 0 refills | Status: AC | PRN

## 2021-07-28 MED ORDER — LEVOCETIRIZINE DIHYDROCHLORIDE 5 MG PO TABS: 5.0000 mg | ORAL_TABLET | Freq: Every evening | ORAL | 0 refills | Status: AC

## 2021-07-28 MED ORDER — ALBUTEROL SULFATE HFA 108 (90 BASE) MCG/ACT IN AERS: 1.0000 | INHALATION_SPRAY | Freq: Four times a day (QID) | RESPIRATORY_TRACT | 0 refills | Status: AC | PRN

## 2021-07-28 MED ORDER — MONTELUKAST SODIUM 10 MG PO TABS: 10.0000 mg | ORAL_TABLET | Freq: Every day | ORAL | 0 refills | Status: AC

## 2021-07-28 NOTE — ED Provider Notes (Signed)
[facility]   MRN: [id number] DOB: 1980/09/30  Subjective:   Donald Rollins is a 41 y.o. male presenting for 5-day history of acute onset persistent coughing, shortness of breath, chest congestion, wheezing, sinus headaches.  Patient did a COVID test at home and was negative on Tuesday.  He is a smoker, does 1/2 pack/day.  He is trying to cut back, has more than 10-pack-year history.  No active chest pain, fevers.  No diabetes per patient.  He does have a history of childhood asthma.  Does not have an inhaler.  He does take Flonase for his allergies.  No current facility-administered medications for this encounter.  Current Outpatient Medications:    cetirizine (ZYRTEC ALLERGY) 10 MG tablet, Take 1 tablet (10 mg total) by mouth daily., Disp: 30 tablet, Rfl: 3   fluticasone (FLONASE) 50 MCG/ACT nasal spray, Place 1 spray into both nostrils 2 (two) times daily., Disp: 16 g, Rfl: 3   ibuprofen (ADVIL) 800 MG tablet, Take 1 tablet (800 mg total) by mouth 3 (three) times daily., Disp: 21 tablet, Rfl: 0   levothyroxine (SYNTHROID) 50 MCG tablet, Take 50 mcg by mouth daily., Disp: , Rfl:    OZEMPIC, 0.25 OR 0.5 MG/DOSE, 2 MG/1.5ML SOPN, Inject 1.5 mLs into the skin once. Once a week dose, Disp: , Rfl:    Vitamin D, Ergocalciferol, (DRISDOL) 1.25 MG (50000 UT) CAPS capsule, TAKE ONE TABLET ONCE PER WEEK FOR 8 WEEKS FOR VITAMIN D DEFICIENCY, Disp: , Rfl:    No Known Allergies  Past Medical History:  Diagnosis Date   Childhood asthma    Hypothyroidism    Obesity, Class III, BMI 40-49.9 (morbid obesity) (HCC) 12/12/2017   Thyroid disease    Tobacco abuse      Past Surgical History:  Procedure Laterality Date   NO PAST SURGERIES      Family History  Problem Relation Age of Onset   Asthma Mother    Lupus Sister     Social History   Tobacco Use   Smoking status: Every Day    Packs/day: 0.50    Years: 10.00    Pack years: 5.00    Types: Cigarettes   Smokeless  tobacco: Never  Vaping Use   Vaping Use: Never used  Substance Use Topics   Alcohol use: Yes    Comment: Socially (every couple months)   Drug use: No    ROS   Objective:   Vitals: BP (!) 133/93 (BP Location: Right Arm)    Pulse 70    Temp 97.8 F (36.6 C) (Oral)    Resp 18    Ht 5\' 11"  (1.803 m)    Wt (!) 325 lb (147.4 kg)    SpO2 94%    BMI 45.33 kg/m   Physical Exam Constitutional:      General: He is not in acute distress.    Appearance: Normal appearance. He is well-developed and normal weight. He is not ill-appearing, toxic-appearing or diaphoretic.  HENT:     Head: Normocephalic and atraumatic.     Right Ear: Tympanic membrane, ear canal and external ear normal. There is no impacted cerumen.     Left Ear: Tympanic membrane, ear canal and external ear normal. There is no impacted cerumen.     Nose: Congestion and rhinorrhea present.     Mouth/Throat:     Mouth: Mucous membranes are moist.     Pharynx: No oropharyngeal exudate or posterior oropharyngeal erythema.  Eyes:  General: No scleral icterus.       Right eye: No discharge.        Left eye: No discharge.     Extraocular Movements: Extraocular movements intact.     Conjunctiva/sclera: Conjunctivae normal.  Cardiovascular:     Rate and Rhythm: Normal rate and regular rhythm.     Heart sounds: Normal heart sounds. No murmur heard.   No friction rub. No gallop.  Pulmonary:     Effort: Pulmonary effort is normal. No respiratory distress.     Breath sounds: No stridor. Rhonchi (throughout) present. No wheezing or rales.  Musculoskeletal:     Cervical back: Normal range of motion and neck supple. No rigidity. No muscular tenderness.  Neurological:     General: No focal deficit present.     Mental Status: He is alert and oriented to person, place, and time.  Psychiatric:        Mood and Affect: Mood normal.        Behavior: Behavior normal.        Thought Content: Thought content normal.    Assessment and  Plan :   PDMP not reviewed this encounter.  1. Acute bronchitis, unspecified organism   2. Rhonchi   3. Smoker   4. Wheezing   5. Subacute cough     COVID and flu test pending.  Patient has a lung exam consistent with acute bronchitis.  Recommended an oral prednisone course, albuterol inhaler especially in the setting of a history of smoking, childhood asthma.  Use supportive care otherwise for viral bronchitis. Counseled patient on potential for adverse effects with medications prescribed/recommended today, ER and return-to-clinic precautions discussed, patient verbalized understanding.    Donald Rollins, [place] 07/28/21 [phone]

## 2021-07-28 NOTE — ED Triage Notes (Addendum)
Pt reports sinus congestion, headache that has improved with otc medication but reports cough and "chest congestion" and intermittent dyspnea with talking/coughing. Denies any known fevers.  Home covid test negative on Tuesday.

## 2021-07-29 LAB — COVID-19, FLU A+B NAA
Influenza A, NAA: NOT DETECTED
Influenza B, NAA: NOT DETECTED
SARS-CoV-2, NAA: NOT DETECTED

## 2021-10-29 ENCOUNTER — Ambulatory Visit
Admission: EM | Admit: 2021-10-29 | Discharge: 2021-10-29 | Disposition: A | Discharge status: Home / Self Care | Payer: BC Managed Care – PPO | Attending: Family Medicine | Admitting: Family Medicine

## 2021-10-29 DIAGNOSIS — R300 Dysuria: Secondary | ICD-10-CM

## 2021-10-29 DIAGNOSIS — R109 Unspecified abdominal pain: Secondary | ICD-10-CM | POA: Diagnosis not present

## 2021-10-29 DIAGNOSIS — R319 Hematuria, unspecified: Secondary | ICD-10-CM

## 2021-10-29 LAB — POCT URINALYSIS DIP (MANUAL ENTRY)
Bilirubin, UA: NEGATIVE
Blood, UA: NEGATIVE
Glucose, UA: NEGATIVE mg/dL
Ketones, POC UA: NEGATIVE mg/dL
Leukocytes, UA: NEGATIVE
Nitrite, UA: NEGATIVE
Protein Ur, POC: NEGATIVE mg/dL
Spec Grav, UA: >=1.03 — AB (ref 1.010–1.025)
Urobilinogen, UA: 0.2 E.U./dL
pH, UA: 5.5 (ref 5.0–8.0)

## 2021-10-29 MED ORDER — TAMSULOSIN HCL 0.4 MG PO CAPS: 0.4000 mg | ORAL_CAPSULE | Freq: Every day | ORAL | 0 refills | Status: AC

## 2021-10-29 NOTE — ED Provider Notes (Signed)
[facility]    CSN: [id number] Arrival date & time: 10/29/21  1238      History   Chief Complaint Chief Complaint  Patient presents with   possible UTI    HPI Donald Rollins is a 41 y.o. male.   Presenting today with 3-day history of dysuria, hematuria, left flank pain.  Denies fever, chills, abdominal pain, nausea, vomiting, bowel changes, concern for STIs or new sexual partners.  Tried some Azo once or twice with no relief.     Past Medical History:  Diagnosis Date   Childhood asthma    Hypothyroidism    Obesity, Class III, BMI 40-49.9 (morbid obesity) (HCC) 12/12/2017   Thyroid disease    Tobacco abuse     Patient Active Problem List   Diagnosis Date Noted   Personal history of noncompliance with medical treatment, presenting hazards to health 12/19/2017   Sensory disturbance 12/13/2017   Right facial numbness    TIA (transient ischemic attack) 12/12/2017   Dizziness 12/12/2017   Tobacco abuse 12/12/2017   Obesity, Class III, BMI 40-49.9 (morbid obesity) (HCC) 12/12/2017   Hypothyroidism following radioiodine therapy 07/15/2015    Past Surgical History:  Procedure Laterality Date   NO PAST SURGERIES         Home Medications    Prior to Admission medications   Medication Sig Start Date End Date Taking? Authorizing Provider  tamsulosin (FLOMAX) 0.4 MG CAPS capsule Take 1 capsule (0.4 mg total) by mouth daily. 10/29/21  Yes Particia NearingLane, Cecil Bixby Elizabeth, PA-C  albuterol (VENTOLIN HFA) 108 (90 Base) MCG/ACT inhaler Inhale 1-2 puffs into the lungs every 6 (six) hours as needed for wheezing or shortness of breath. 07/28/21   Wallis BambergMani, Mario, PA-C  fluticasone (FLONASE) 50 MCG/ACT nasal spray Place 1 spray into both nostrils 2 (two) times daily. 04/30/21   Particia NearingLane, Verniece Encarnacion Elizabeth, PA-C  ibuprofen (ADVIL) 800 MG tablet Take 1 tablet (800 mg total) by mouth 3 (three) times daily. 02/23/20   Lamptey, Britta MccreedyPhilip O, MD  levocetirizine (XYZAL) 5 MG tablet Take 1 tablet (5 mg  total) by mouth every evening. 07/28/21   Wallis BambergMani, Mario, PA-C  levothyroxine (SYNTHROID) 50 MCG tablet Take 50 mcg by mouth daily. 03/10/19   [provider]  montelukast (SINGULAIR) 10 MG tablet Take 1 tablet (10 mg total) by mouth at bedtime. 07/28/21   Wallis BambergMani, Mario, PA-C  OZEMPIC, 0.25 OR 0.5 MG/DOSE, 2 MG/1.5ML SOPN Inject 1.5 mLs into the skin once. Once a week dose 04/19/21   [provider]  predniSONE (DELTASONE) 50 MG tablet Take 1 tablet (50 mg total) by mouth daily with breakfast. 07/28/21   Wallis BambergMani, Mario, PA-C  promethazine-dextromethorphan (PROMETHAZINE-DM) 6.25-15 MG/5ML syrup Take 5 mLs by mouth 4 (four) times daily as needed for cough. 07/28/21   Wallis BambergMani, Mario, PA-C  Vitamin D, Ergocalciferol, (DRISDOL) 1.25 MG (50000 UT) CAPS capsule TAKE ONE TABLET ONCE PER WEEK FOR 8 WEEKS FOR VITAMIN D DEFICIENCY 03/10/19   [provider]  topiramate (TOPAMAX) 50 MG tablet TAKE 1 TABLET BY MOUTH ONCE DAILY FOR 7 DAYS, THEN 1 TABLET TWICE A DAY IF TOLERATED 02/09/19 02/23/20  Micki RileySethi, Pramod S, MD    Family History Family History  Problem Relation Age of Onset   Asthma Mother    Lupus Sister     Social History Social History   Tobacco Use   Smoking status: Every Day    Packs/day: 0.50    Years: 10.00    Pack years: 5.00  Types: Cigarettes   Smokeless tobacco: Never  Vaping Use   Vaping Use: Never used  Substance Use Topics   Alcohol use: Yes    Comment: Socially (every couple months)   Drug use: No     Allergies   Patient has no known allergies.   Review of Systems Review of Systems Per HPI  Physical Exam Triage Vital Signs ED Triage Vitals  Enc Vitals Group     BP 10/29/21 1244 120/83     Pulse Rate 10/29/21 1244 98     Resp 10/29/21 1244 20     Temp 10/29/21 1244 97.9 F (36.6 C)     Temp Source 10/29/21 1244 Oral     SpO2 10/29/21 1244 94 %     Weight --      Height --      Head Circumference --      Peak Flow --      Pain Score 10/29/21  1247 6     Pain Loc --      Pain Edu? --      Excl. in GC? --    No data found.  Updated Vital Signs BP 120/83 (BP Location: Right Arm)   Pulse 98   Temp 97.9 F (36.6 C) (Oral)   Resp 20   SpO2 94%   Visual Acuity Right Eye Distance:   Left Eye Distance:   Bilateral Distance:    Right Eye Near:   Left Eye Near:    Bilateral Near:     Physical Exam Vitals and nursing note reviewed.  Constitutional:      Appearance: Normal appearance.  HENT:     Head: Atraumatic.  Eyes:     Extraocular Movements: Extraocular movements intact.     Conjunctiva/sclera: Conjunctivae normal.  Cardiovascular:     Rate and Rhythm: Normal rate and regular rhythm.  Pulmonary:     Effort: Pulmonary effort is normal.     Breath sounds: Normal breath sounds.  Abdominal:     General: Bowel sounds are normal. There is no distension.     Palpations: Abdomen is soft.     Tenderness: There is no abdominal tenderness. There is no right CVA tenderness, left CVA tenderness or guarding.  Genitourinary:    Comments: GU exam deferred Musculoskeletal:        General: Normal range of motion.     Cervical back: Normal range of motion and neck supple.  Skin:    General: Skin is warm and dry.  Neurological:     General: No focal deficit present.     Mental Status: Donald Rollins is oriented to person, place, and time.  Psychiatric:        Mood and Affect: Mood normal.        Thought Content: Thought content normal.        Judgment: Judgment normal.     UC Treatments / Results  Labs (all labs ordered are listed, but only abnormal results are displayed) Labs Reviewed  POCT URINALYSIS DIP (MANUAL ENTRY) - Abnormal; Notable for the following components:      Result Value   Spec Grav, UA >=1.030 (*)    All other components within normal limits    EKG   Radiology No results found.  Procedures Procedures (including critical care time)  Medications Ordered in UC Medications - No data to  display  Initial Impression / Assessment and Plan / UC Course  I have reviewed the triage vital signs and the nursing  notes.  Pertinent labs & imaging results that were available during my care of the patient were reviewed by me and considered in my medical decision making (see chart for details).     Urinalysis benign today, vitals and exam reassuring.  Donald Rollins declines penile swab today as Donald Rollins is not concerned for STIs.  Discussed possible kidney stone despite hematuria on urinalysis today.  Trial Flomax, fluids and follow-up with PCP next week.  Final Clinical Impressions(s) / UC Diagnoses   Final diagnoses:  Left flank pain  Hematuria, unspecified type  Dysuria   Discharge Instructions   None    ED Prescriptions     Medication Sig Dispense Auth. Provider   tamsulosin (FLOMAX) 0.4 MG CAPS capsule Take 1 capsule (0.4 mg total) by mouth daily. 14 capsule Particia Nearing, [place]      PDMP not reviewed this encounter.   Particia Nearing, [place] 10/29/21 1348

## 2021-10-29 NOTE — ED Triage Notes (Signed)
Pt states he has some burning during urination and seen some blood in his urine as well since Friday   Pt states his lower back is hurting  Denies Fever

## 2021-11-20 ENCOUNTER — Ambulatory Visit (HOSPITAL_COMMUNITY)
Admission: RE | Admit: 2021-11-20 | Discharge: 2021-11-20 | Disposition: A | Discharge status: Home / Self Care | Payer: BC Managed Care – PPO | Source: Ambulatory Visit | Attending: Family Medicine | Admitting: Family Medicine

## 2021-11-20 ENCOUNTER — Other Ambulatory Visit (HOSPITAL_COMMUNITY): Payer: Self-pay | Admitting: Family Medicine

## 2021-11-20 DIAGNOSIS — R319 Hematuria, unspecified: Secondary | ICD-10-CM

## 2022-11-18 IMAGING — CR DG CHEST 2V
2 series · 2 of 2 positions shown · non-contrast
Comparison: 12/18/2018

CLINICAL DATA: Intercostal chest pain for several weeks

EXAM:
CHEST - 2 VIEW

[w pa chest]
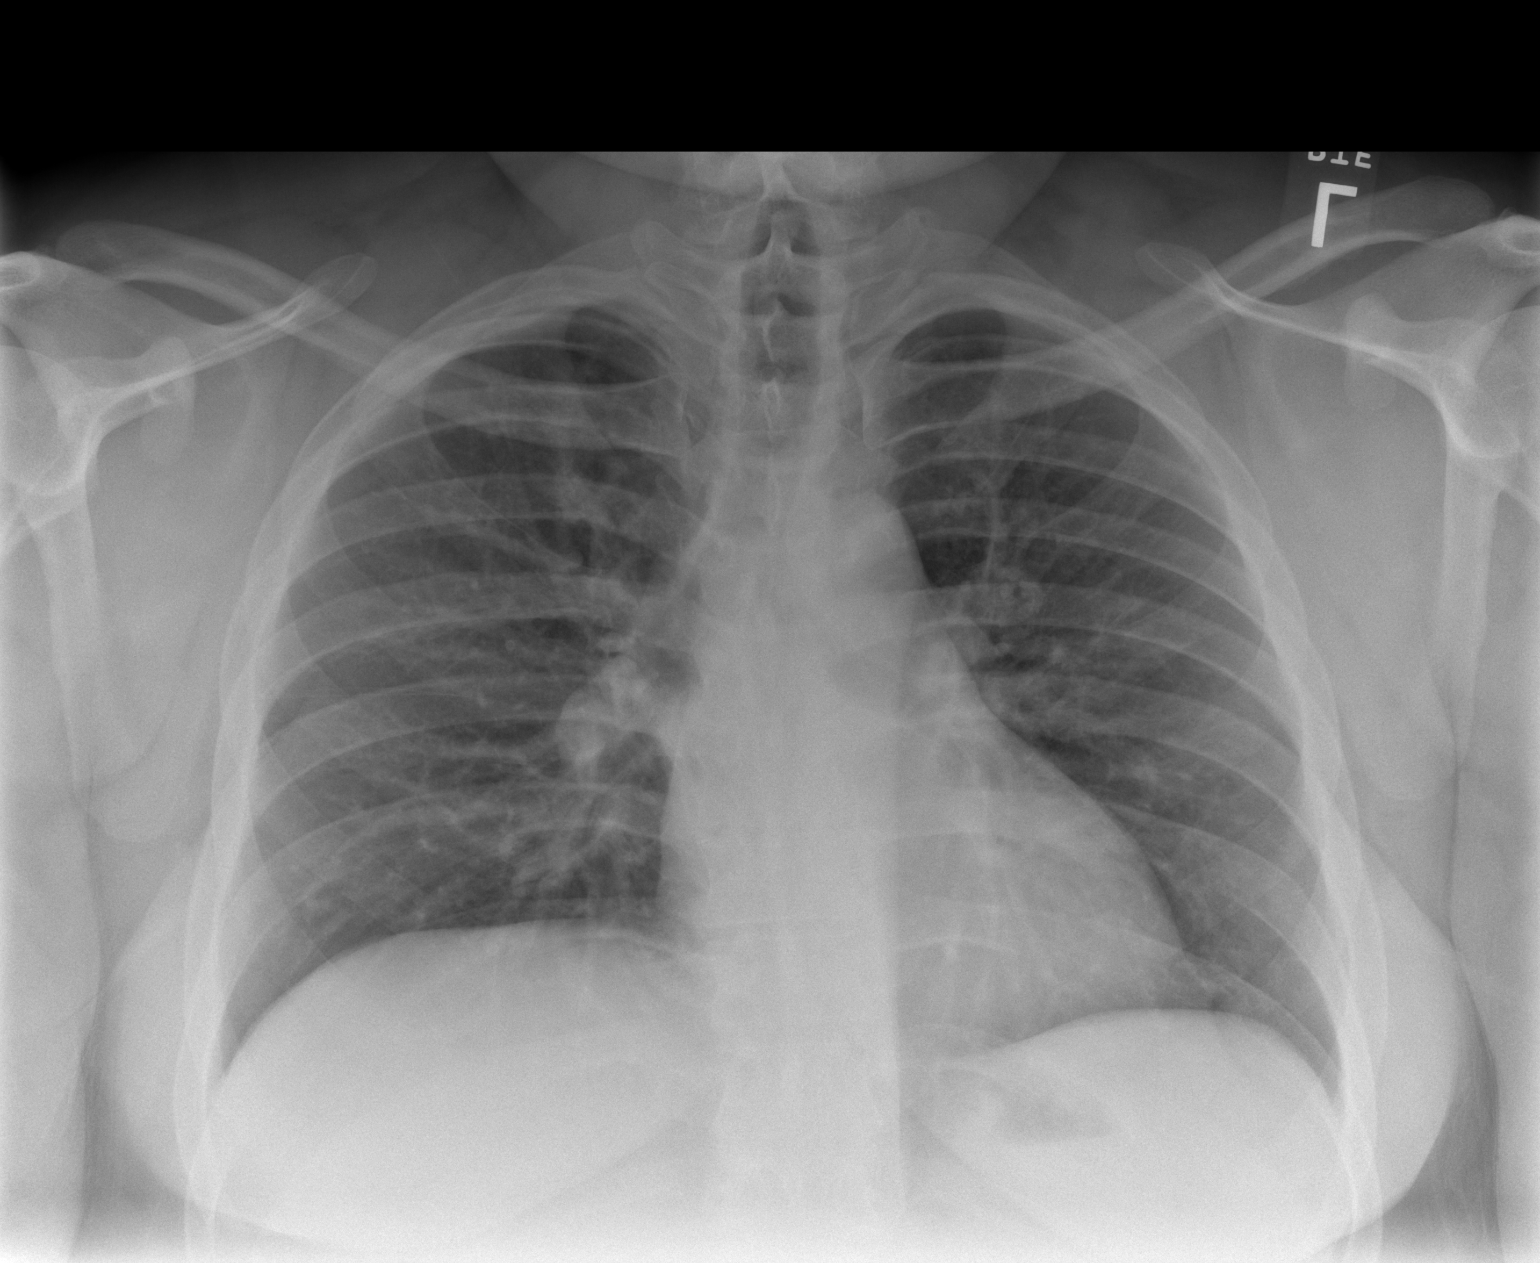

[w chest lat]
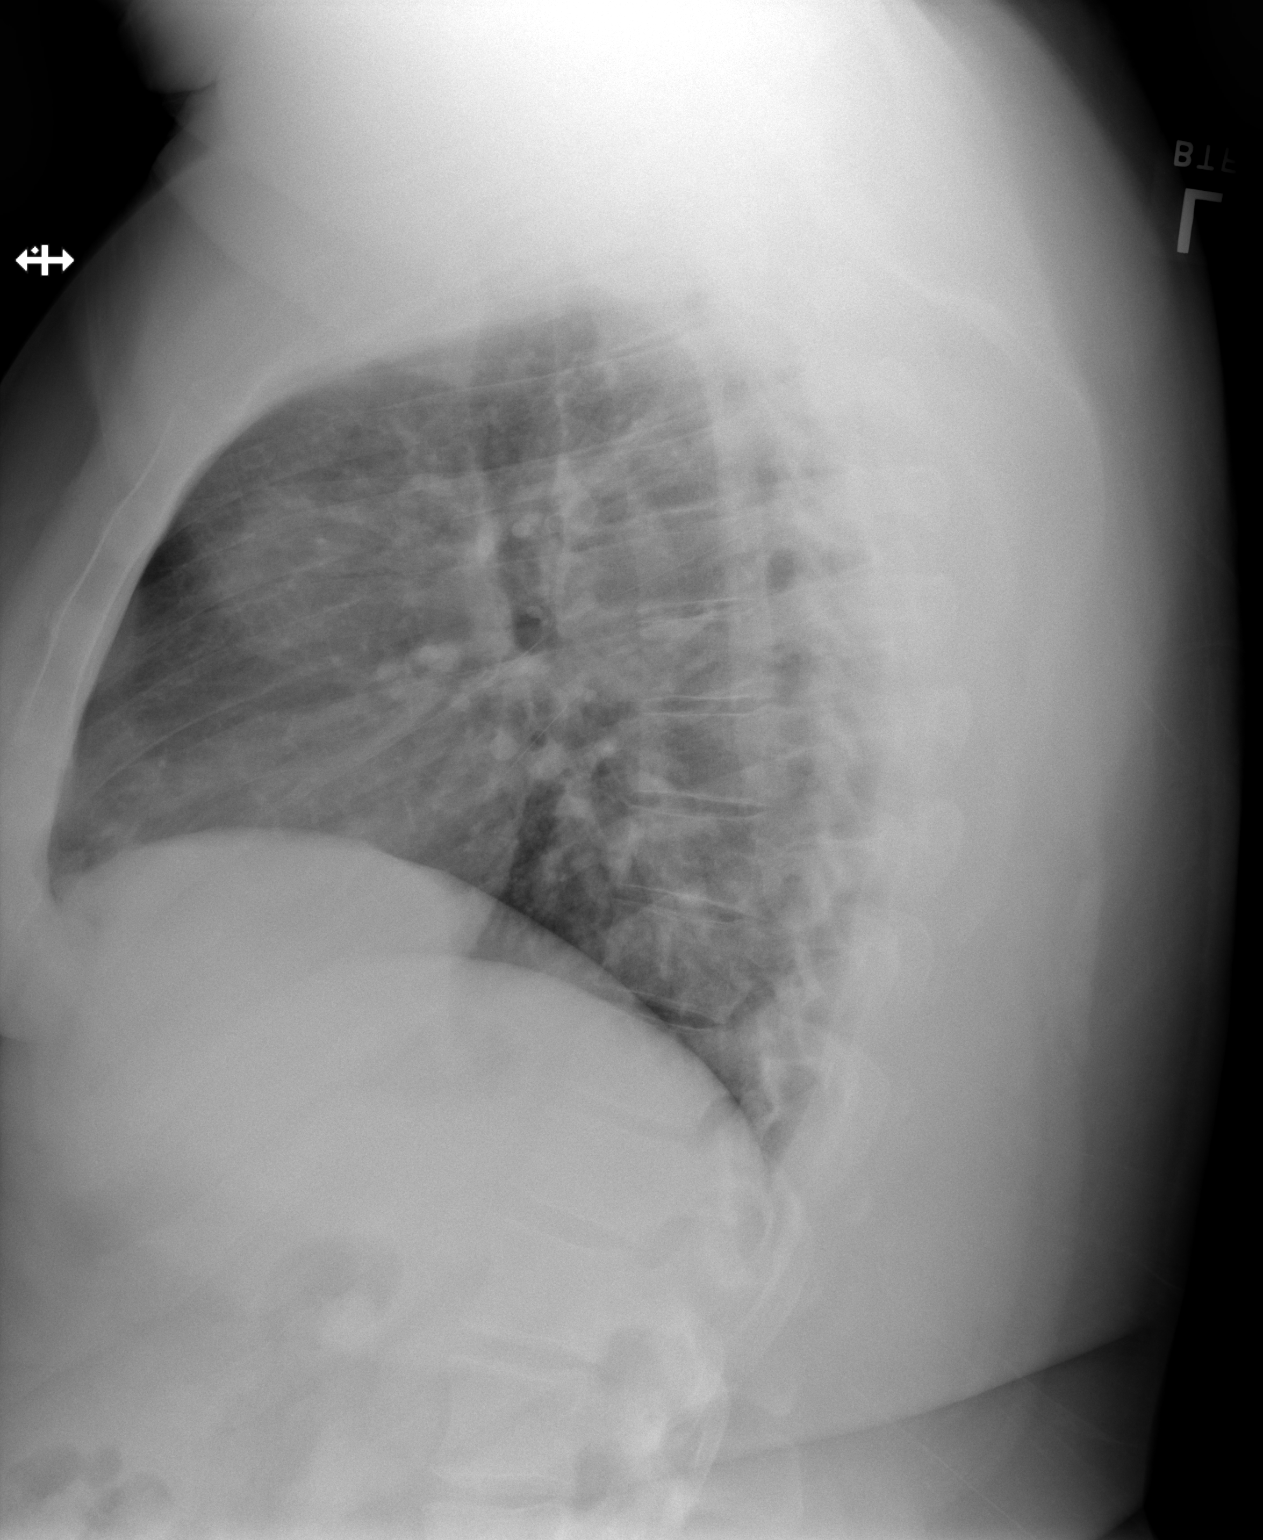

[2 of 2 positions shown; findings below may reference images not displayed]

FINDINGS: The heart size and mediastinal contours are within normal limits.
Both lungs are clear. The visualized skeletal structures are
unremarkable.
IMPRESSION: No active cardiopulmonary disease.

## 2023-08-26 ENCOUNTER — Other Ambulatory Visit (HOSPITAL_COMMUNITY): Payer: Self-pay | Admitting: Nurse Practitioner

## 2023-08-26 DIAGNOSIS — R55 Syncope and collapse: Secondary | ICD-10-CM

## 2023-09-02 ENCOUNTER — Ambulatory Visit (HOSPITAL_COMMUNITY)
Admission: RE | Admit: 2023-09-02 | Discharge: 2023-09-02 | Disposition: A | Discharge status: Home / Self Care | Source: Ambulatory Visit | Attending: Nurse Practitioner | Admitting: Nurse Practitioner

## 2023-09-02 DIAGNOSIS — R55 Syncope and collapse: Secondary | ICD-10-CM | POA: Insufficient documentation
